# Patient Record
Sex: Female | Born: 1940
Health system: Southern US, Community
[De-identification: ages and names within clinical notes are randomized; demographics above are authoritative.]

## PROBLEM LIST (undated history)

## (undated) DIAGNOSIS — C679 Malignant neoplasm of bladder, unspecified: Secondary | ICD-10-CM

## (undated) DIAGNOSIS — I509 Heart failure, unspecified: Secondary | ICD-10-CM

## (undated) DIAGNOSIS — I10 Essential (primary) hypertension: Secondary | ICD-10-CM

## (undated) DIAGNOSIS — E079 Disorder of thyroid, unspecified: Secondary | ICD-10-CM

## (undated) DIAGNOSIS — M797 Fibromyalgia: Secondary | ICD-10-CM

## (undated) DIAGNOSIS — D649 Anemia, unspecified: Secondary | ICD-10-CM

## (undated) HISTORY — PX: BREAST LUMPECTOMY: SHX2

## (undated) HISTORY — DX: Malignant neoplasm of bladder, unspecified: C67.9

## (undated) HISTORY — PX: CARDIAC CATHETERIZATION: SHX172

## (undated) HISTORY — PX: BREAST SURGERY: SHX581

## (undated) HISTORY — DX: Essential (primary) hypertension: I10

## (undated) HISTORY — PX: COLONOSCOPY: SHX174

## (undated) HISTORY — DX: Fibromyalgia: M79.7

## (undated) HISTORY — DX: Heart failure, unspecified: I50.9

## (undated) HISTORY — PX: BLADDER SURGERY: SHX569

## (undated) HISTORY — DX: Disorder of thyroid, unspecified: E07.9

## (undated) HISTORY — DX: Anemia, unspecified: D64.9

## (undated) HISTORY — PX: CATARACT EXTRACTION: SUR2

## (undated) HISTORY — PX: CHOLECYSTECTOMY: SHX55

## (undated) HISTORY — PX: ABDOMINAL HYSTERECTOMY: SHX81

---

## 1999-11-04 ENCOUNTER — Ambulatory Visit (HOSPITAL_COMMUNITY): Admission: RE | Admit: 1999-11-04 | Discharge: 1999-11-04 | Payer: Self-pay | Admitting: Internal Medicine

## 1999-11-04 ENCOUNTER — Encounter: Payer: Self-pay | Admitting: Internal Medicine

## 2002-06-22 ENCOUNTER — Encounter: Payer: Self-pay | Admitting: Internal Medicine

## 2002-06-22 ENCOUNTER — Inpatient Hospital Stay (HOSPITAL_COMMUNITY): Admission: EM | Admit: 2002-06-22 | Discharge: 2002-06-24 | Payer: Self-pay | Admitting: Emergency Medicine

## 2002-07-08 ENCOUNTER — Encounter: Admission: RE | Admit: 2002-07-08 | Discharge: 2002-10-06 | Payer: Self-pay | Admitting: Internal Medicine

## 2002-12-24 LAB — HM COLONOSCOPY

## 2004-01-05 ENCOUNTER — Ambulatory Visit: Payer: Self-pay | Admitting: Internal Medicine

## 2004-01-15 ENCOUNTER — Ambulatory Visit: Payer: Self-pay | Admitting: Internal Medicine

## 2004-09-11 ENCOUNTER — Ambulatory Visit: Payer: Self-pay | Admitting: Internal Medicine

## 2004-09-16 ENCOUNTER — Ambulatory Visit: Payer: Self-pay | Admitting: Internal Medicine

## 2004-11-22 ENCOUNTER — Ambulatory Visit: Payer: Self-pay | Admitting: Internal Medicine

## 2005-04-04 ENCOUNTER — Ambulatory Visit: Payer: Self-pay | Admitting: Internal Medicine

## 2005-04-10 ENCOUNTER — Ambulatory Visit: Payer: Self-pay | Admitting: Internal Medicine

## 2005-04-29 ENCOUNTER — Ambulatory Visit: Payer: Self-pay | Admitting: Internal Medicine

## 2005-07-09 ENCOUNTER — Ambulatory Visit: Payer: Self-pay | Admitting: Internal Medicine

## 2005-09-27 ENCOUNTER — Ambulatory Visit: Payer: Self-pay | Admitting: Internal Medicine

## 2006-02-10 ENCOUNTER — Ambulatory Visit: Payer: Self-pay | Admitting: Internal Medicine

## 2006-02-10 LAB — CONVERTED CEMR LAB
ALT: 21 units/L (ref 0–40)
AST: 23 units/L (ref 0–37)
BUN: 15 mg/dL (ref 6–23)
Basophils Absolute: 0.1 10*3/uL (ref 0.0–0.1)
Basophils Relative: 1 % (ref 0.0–1.0)
Chol/HDL Ratio, serum: 6
Cholesterol: 259 mg/dL (ref 0–200)
Creatinine, Ser: 0.8 mg/dL (ref 0.4–1.2)
Creatinine,U: 100.8 mg/dL
Digoxin level, serum: 0.5 ng/mL — ABNORMAL LOW (ref 0.8–2.0)
Eosinophil percent: 2 % (ref 0.0–5.0)
HCT: 40.1 % (ref 36.0–46.0)
HDL: 43 mg/dL (ref >39.0)
Hemoglobin: 13.3 g/dL (ref 12.0–15.0)
Hgb A1c MFr Bld: 7.6 % — ABNORMAL HIGH (ref 4.6–6.0)
LDL DIRECT: 166.6 mg/dL
Lymphocytes Relative: 20.5 % (ref 12.0–46.0)
MCHC: 33.3 g/dL (ref 30.0–36.0)
MCV: 85.6 fL (ref 78.0–100.0)
Microalb Creat Ratio: 20.8 mg/g (ref 0.0–30.0)
Microalb, Ur: 2.1 mg/dL — ABNORMAL HIGH (ref 0.0–1.9)
Monocytes Absolute: 0.7 10*3/uL (ref 0.2–0.7)
Monocytes Relative: 6 % (ref 3.0–11.0)
Neutro Abs: 8.9 10*3/uL — ABNORMAL HIGH (ref 1.4–7.7)
Neutrophils Relative %: 70.5 % (ref 43.0–77.0)
Platelets: 310 10*3/uL (ref 150–400)
Potassium: 3.9 meq/L (ref 3.5–5.1)
RBC: 4.68 M/uL (ref 3.87–5.11)
RDW: 12.6 % (ref 11.5–14.6)
Triglyceride fasting, serum: 285 mg/dL (ref 0–149)
VLDL: 57 mg/dL — ABNORMAL HIGH (ref 0–40)
WBC: 12.4 10*3/uL — ABNORMAL HIGH (ref 4.5–10.5)

## 2006-05-29 DIAGNOSIS — E039 Hypothyroidism, unspecified: Secondary | ICD-10-CM | POA: Insufficient documentation

## 2006-05-29 DIAGNOSIS — I509 Heart failure, unspecified: Secondary | ICD-10-CM | POA: Insufficient documentation

## 2006-05-29 DIAGNOSIS — K529 Noninfective gastroenteritis and colitis, unspecified: Secondary | ICD-10-CM | POA: Insufficient documentation

## 2006-07-06 ENCOUNTER — Ambulatory Visit: Payer: Self-pay | Admitting: Internal Medicine

## 2006-07-06 DIAGNOSIS — L259 Unspecified contact dermatitis, unspecified cause: Secondary | ICD-10-CM | POA: Insufficient documentation

## 2006-07-09 ENCOUNTER — Ambulatory Visit: Payer: Self-pay | Admitting: Internal Medicine

## 2006-08-10 ENCOUNTER — Ambulatory Visit: Payer: Self-pay | Admitting: Internal Medicine

## 2006-08-10 LAB — CONVERTED CEMR LAB
ALT: 25 units/L (ref 0–35)
AST: 20 units/L (ref 0–37)
BUN: 12 mg/dL (ref 6–23)
Cholesterol: 284 mg/dL (ref 0–200)
Creatinine, Ser: 0.7 mg/dL (ref 0.4–1.2)
Creatinine,U: 105.5 mg/dL
Direct LDL: 178.2 mg/dL
HDL: 41.7 mg/dL (ref >39.0)
Hgb A1c MFr Bld: 8.4 % — ABNORMAL HIGH (ref 4.6–6.0)
Microalb Creat Ratio: 46.4 mg/g — ABNORMAL HIGH (ref 0.0–30.0)
Microalb, Ur: 4.9 mg/dL — ABNORMAL HIGH (ref 0.0–1.9)
Potassium: 3.9 meq/L (ref 3.5–5.1)
TSH: 2.37 microintl units/mL (ref 0.35–5.50)
Total CHOL/HDL Ratio: 6.8
Triglycerides: 450 mg/dL (ref 0–149)
VLDL: 90 mg/dL — ABNORMAL HIGH (ref 0–40)

## 2006-08-14 ENCOUNTER — Ambulatory Visit: Payer: Self-pay | Admitting: Internal Medicine

## 2006-08-14 LAB — CONVERTED CEMR LAB
Cholesterol, target level: 200 mg/dL
HDL goal, serum: 40 mg/dL
LDL Goal: 100 mg/dL

## 2006-10-30 ENCOUNTER — Ambulatory Visit: Payer: Self-pay | Admitting: Internal Medicine

## 2006-11-01 LAB — CONVERTED CEMR LAB
ALT: 26 units/L (ref 0–35)
AST: 22 units/L (ref 0–37)
Cholesterol: 200 mg/dL (ref 0–200)
Direct LDL: 100.7 mg/dL
HDL: 41 mg/dL (ref >39.0)
Hgb A1c MFr Bld: 8.1 % — ABNORMAL HIGH (ref 4.6–6.0)
Total CHOL/HDL Ratio: 4.9
Triglycerides: 455 mg/dL (ref 0–149)
VLDL: 91 mg/dL — ABNORMAL HIGH (ref 0–40)

## 2006-11-02 ENCOUNTER — Encounter (INDEPENDENT_AMBULATORY_CARE_PROVIDER_SITE_OTHER): Payer: Self-pay | Admitting: *Deleted

## 2006-11-05 ENCOUNTER — Ambulatory Visit: Payer: Self-pay | Admitting: Internal Medicine

## 2006-11-05 DIAGNOSIS — E1165 Type 2 diabetes mellitus with hyperglycemia: Secondary | ICD-10-CM | POA: Insufficient documentation

## 2006-11-11 ENCOUNTER — Telehealth (INDEPENDENT_AMBULATORY_CARE_PROVIDER_SITE_OTHER): Payer: Self-pay | Admitting: *Deleted

## 2006-11-12 ENCOUNTER — Telehealth (INDEPENDENT_AMBULATORY_CARE_PROVIDER_SITE_OTHER): Payer: Self-pay | Admitting: *Deleted

## 2007-01-12 ENCOUNTER — Telehealth (INDEPENDENT_AMBULATORY_CARE_PROVIDER_SITE_OTHER): Payer: Self-pay | Admitting: *Deleted

## 2007-03-25 ENCOUNTER — Telehealth (INDEPENDENT_AMBULATORY_CARE_PROVIDER_SITE_OTHER): Payer: Self-pay | Admitting: *Deleted

## 2007-05-06 ENCOUNTER — Telehealth (INDEPENDENT_AMBULATORY_CARE_PROVIDER_SITE_OTHER): Payer: Self-pay | Admitting: *Deleted

## 2007-05-10 ENCOUNTER — Telehealth (INDEPENDENT_AMBULATORY_CARE_PROVIDER_SITE_OTHER): Payer: Self-pay | Admitting: *Deleted

## 2007-05-11 ENCOUNTER — Encounter: Payer: Self-pay | Admitting: Internal Medicine

## 2007-05-11 ENCOUNTER — Telehealth: Payer: Self-pay | Admitting: Internal Medicine

## 2007-05-13 ENCOUNTER — Ambulatory Visit: Payer: Self-pay | Admitting: Internal Medicine

## 2007-05-13 LAB — CONVERTED CEMR LAB
ALT: 24 units/L (ref 0–35)
AST: 24 units/L (ref 0–37)
BUN: 13 mg/dL (ref 6–23)
Cholesterol: 164 mg/dL (ref 0–200)
Creatinine, Ser: 0.8 mg/dL (ref 0.4–1.2)
Direct LDL: 85.7 mg/dL
HDL: 37.1 mg/dL — ABNORMAL LOW (ref >39.0)
Hgb A1c MFr Bld: 8.8 % — ABNORMAL HIGH (ref 4.6–6.0)
TSH: 1.78 microintl units/mL (ref 0.35–5.50)
Total CHOL/HDL Ratio: 4.4
Triglycerides: 307 mg/dL (ref 0–149)
VLDL: 61 mg/dL — ABNORMAL HIGH (ref 0–40)

## 2007-05-20 ENCOUNTER — Ambulatory Visit: Payer: Self-pay | Admitting: Internal Medicine

## 2007-05-20 DIAGNOSIS — E782 Mixed hyperlipidemia: Secondary | ICD-10-CM | POA: Insufficient documentation

## 2007-06-01 ENCOUNTER — Telehealth (INDEPENDENT_AMBULATORY_CARE_PROVIDER_SITE_OTHER): Payer: Self-pay | Admitting: *Deleted

## 2007-08-20 ENCOUNTER — Ambulatory Visit: Payer: Self-pay | Admitting: Internal Medicine

## 2007-08-20 DIAGNOSIS — R5383 Other fatigue: Secondary | ICD-10-CM

## 2007-08-20 DIAGNOSIS — IMO0001 Reserved for inherently not codable concepts without codable children: Secondary | ICD-10-CM | POA: Insufficient documentation

## 2007-08-20 DIAGNOSIS — R5381 Other malaise: Secondary | ICD-10-CM | POA: Insufficient documentation

## 2007-08-30 LAB — CONVERTED CEMR LAB
ALT: 25 units/L (ref 0–35)
AST: 28 units/L (ref 0–37)
Albumin: 3.6 g/dL (ref 3.5–5.2)
Alkaline Phosphatase: 54 units/L (ref 39–117)
BUN: 10 mg/dL (ref 6–23)
Basophils Absolute: 0.1 10*3/uL (ref 0.0–0.1)
Basophils Relative: 0.9 % (ref 0.0–3.0)
Bilirubin, Direct: 0.1 mg/dL (ref 0.0–0.3)
Cholesterol: 169 mg/dL (ref 0–200)
Creatinine, Ser: 0.8 mg/dL (ref 0.4–1.2)
Direct LDL: 89.8 mg/dL
Eosinophils Absolute: 0.3 10*3/uL (ref 0.0–0.7)
Eosinophils Relative: 3.4 % (ref 0.0–5.0)
HCT: 36.2 % (ref 36.0–46.0)
HDL: 37 mg/dL — ABNORMAL LOW (ref >39.0)
Hemoglobin: 12.3 g/dL (ref 12.0–15.0)
Hgb A1c MFr Bld: 8.3 % — ABNORMAL HIGH (ref 4.6–6.0)
Lymphocytes Relative: 23.5 % (ref 12.0–46.0)
MCHC: 33.9 g/dL (ref 30.0–36.0)
MCV: 83 fL (ref 78.0–100.0)
Monocytes Absolute: 0.9 10*3/uL (ref 0.1–1.0)
Monocytes Relative: 9.7 % (ref 3.0–12.0)
Neutro Abs: 5.7 10*3/uL (ref 1.4–7.7)
Neutrophils Relative %: 62.5 % (ref 43.0–77.0)
Platelets: 238 10*3/uL (ref 150–400)
Potassium: 4.4 meq/L (ref 3.5–5.1)
RBC: 4.36 M/uL (ref 3.87–5.11)
RDW: 12.6 % (ref 11.5–14.6)
TSH: 1.12 microintl units/mL (ref 0.35–5.50)
Total Bilirubin: 0.7 mg/dL (ref 0.3–1.2)
Total CHOL/HDL Ratio: 4.6
Total Protein: 7.1 g/dL (ref 6.0–8.3)
Triglycerides: 291 mg/dL (ref 0–149)
VLDL: 58 mg/dL — ABNORMAL HIGH (ref 0–40)
Vit D, 1,25-Dihydroxy: 22 — ABNORMAL LOW (ref 30–89)
WBC: 9.1 10*3/uL (ref 4.5–10.5)

## 2008-03-28 ENCOUNTER — Telehealth (INDEPENDENT_AMBULATORY_CARE_PROVIDER_SITE_OTHER): Payer: Self-pay | Admitting: *Deleted

## 2008-03-28 ENCOUNTER — Ambulatory Visit: Payer: Self-pay | Admitting: Internal Medicine

## 2008-04-13 LAB — CONVERTED CEMR LAB
BUN: 15 mg/dL (ref 6–23)
Creatinine, Ser: 0.8 mg/dL (ref 0.4–1.2)
Creatinine,U: 104.7 mg/dL
Hgb A1c MFr Bld: 7.9 % — ABNORMAL HIGH (ref 4.6–6.0)
Microalb Creat Ratio: 166.2 mg/g — ABNORMAL HIGH (ref 0.0–30.0)
Microalb, Ur: 17.4 mg/dL — ABNORMAL HIGH (ref 0.0–1.9)
Potassium: 4.1 meq/L (ref 3.5–5.1)

## 2008-04-27 ENCOUNTER — Ambulatory Visit: Payer: Self-pay | Admitting: Internal Medicine

## 2008-04-27 DIAGNOSIS — I10 Essential (primary) hypertension: Secondary | ICD-10-CM | POA: Insufficient documentation

## 2008-05-12 ENCOUNTER — Telehealth (INDEPENDENT_AMBULATORY_CARE_PROVIDER_SITE_OTHER): Payer: Self-pay | Admitting: *Deleted

## 2008-06-22 ENCOUNTER — Ambulatory Visit: Payer: Self-pay | Admitting: Internal Medicine

## 2008-06-30 ENCOUNTER — Encounter (INDEPENDENT_AMBULATORY_CARE_PROVIDER_SITE_OTHER): Payer: Self-pay | Admitting: *Deleted

## 2008-06-30 LAB — CONVERTED CEMR LAB
ALT: 20 units/L (ref 0–35)
AST: 22 units/L (ref 0–37)
Albumin: 3.5 g/dL (ref 3.5–5.2)
Alkaline Phosphatase: 57 units/L (ref 39–117)
BUN: 15 mg/dL (ref 6–23)
Bilirubin, Direct: 0 mg/dL (ref 0.0–0.3)
Cholesterol: 127 mg/dL (ref 0–200)
Creatinine, Ser: 0.8 mg/dL (ref 0.4–1.2)
Direct LDL: 68.1 mg/dL
HDL: 36.9 mg/dL — ABNORMAL LOW (ref >39.00)
Hgb A1c MFr Bld: 7.8 % — ABNORMAL HIGH (ref 4.6–6.5)
Potassium: 3.5 meq/L (ref 3.5–5.1)
TSH: 1.11 microintl units/mL (ref 0.35–5.50)
Total Bilirubin: 0.3 mg/dL (ref 0.3–1.2)
Total CHOL/HDL Ratio: 3
Total Protein: 6.8 g/dL (ref 6.0–8.3)
Triglycerides: 213 mg/dL — ABNORMAL HIGH (ref 0.0–149.0)
VLDL: 42.6 mg/dL — ABNORMAL HIGH (ref 0.0–40.0)

## 2008-07-11 ENCOUNTER — Telehealth: Payer: Self-pay | Admitting: Internal Medicine

## 2008-07-13 ENCOUNTER — Encounter (INDEPENDENT_AMBULATORY_CARE_PROVIDER_SITE_OTHER): Payer: Self-pay | Admitting: *Deleted

## 2008-08-31 ENCOUNTER — Encounter: Payer: Self-pay | Admitting: Internal Medicine

## 2008-09-26 ENCOUNTER — Ambulatory Visit: Payer: Self-pay | Admitting: Internal Medicine

## 2008-09-26 DIAGNOSIS — L509 Urticaria, unspecified: Secondary | ICD-10-CM | POA: Insufficient documentation

## 2009-01-15 ENCOUNTER — Telehealth (INDEPENDENT_AMBULATORY_CARE_PROVIDER_SITE_OTHER): Payer: Self-pay | Admitting: *Deleted

## 2009-02-22 ENCOUNTER — Ambulatory Visit: Payer: Self-pay | Admitting: Internal Medicine

## 2009-02-22 LAB — CONVERTED CEMR LAB
Protein, U semiquant: NEGATIVE
Urobilinogen, UA: 0.2

## 2009-02-26 LAB — CONVERTED CEMR LAB
Basophils Absolute: 0.1 10*3/uL (ref 0.0–0.1)
Bilirubin, Direct: 0 mg/dL (ref 0.0–0.3)
Cholesterol: 143 mg/dL (ref 0–200)
Creatinine, Ser: 0.8 mg/dL (ref 0.4–1.2)
Eosinophils Absolute: 0.2 10*3/uL (ref 0.0–0.7)
Eosinophils Relative: 2.3 % (ref 0.0–5.0)
LDL Cholesterol: 62 mg/dL (ref 0–99)
MCV: 75.2 fL — ABNORMAL LOW (ref 78.0–100.0)
Monocytes Absolute: 0.9 10*3/uL (ref 0.1–1.0)
Neutrophils Relative %: 64.8 % (ref 43.0–77.0)
Platelets: 300 10*3/uL (ref 150.0–400.0)
Potassium: 4.5 meq/L (ref 3.5–5.1)
RDW: 15.9 % — ABNORMAL HIGH (ref 11.5–14.6)
TSH: 0.96 microintl units/mL (ref 0.35–5.50)
Total Bilirubin: 0.6 mg/dL (ref 0.3–1.2)
Total Protein: 7.5 g/dL (ref 6.0–8.3)
VLDL: 36.8 mg/dL (ref 0.0–40.0)
WBC: 10.4 10*3/uL (ref 4.5–10.5)

## 2009-03-05 ENCOUNTER — Telehealth (INDEPENDENT_AMBULATORY_CARE_PROVIDER_SITE_OTHER): Payer: Self-pay | Admitting: *Deleted

## 2009-03-05 ENCOUNTER — Ambulatory Visit: Payer: Self-pay | Admitting: Internal Medicine

## 2009-03-06 ENCOUNTER — Encounter: Payer: Self-pay | Admitting: Internal Medicine

## 2009-03-21 ENCOUNTER — Telehealth: Payer: Self-pay | Admitting: Internal Medicine

## 2009-03-21 DIAGNOSIS — R350 Frequency of micturition: Secondary | ICD-10-CM | POA: Insufficient documentation

## 2009-03-22 ENCOUNTER — Encounter: Payer: Self-pay | Admitting: Internal Medicine

## 2009-03-27 ENCOUNTER — Encounter: Payer: Self-pay | Admitting: Internal Medicine

## 2009-03-28 ENCOUNTER — Telehealth: Payer: Self-pay | Admitting: Internal Medicine

## 2009-04-04 ENCOUNTER — Telehealth: Payer: Self-pay | Admitting: Internal Medicine

## 2009-04-19 ENCOUNTER — Encounter: Payer: Self-pay | Admitting: Internal Medicine

## 2009-04-25 ENCOUNTER — Ambulatory Visit: Payer: Self-pay | Admitting: Internal Medicine

## 2009-04-30 LAB — CONVERTED CEMR LAB
Basophils Relative: 1.1 % (ref 0.0–3.0)
Eosinophils Relative: 3.3 % (ref 0.0–5.0)
Folate: >20 ng/mL
Iron: 35 ug/dL — ABNORMAL LOW (ref 42–145)
Lymphocytes Relative: 22.1 % (ref 12.0–46.0)
Monocytes Relative: 6.6 % (ref 3.0–12.0)
Neutrophils Relative %: 66.9 % (ref 43.0–77.0)
Platelets: 304 10*3/uL (ref 150.0–400.0)
RBC: 4.34 M/uL (ref 3.87–5.11)
Saturation Ratios: 6.8 % — ABNORMAL LOW (ref 20.0–50.0)
Transferrin: 365.4 mg/dL — ABNORMAL HIGH (ref 212.0–360.0)
WBC: 11.8 10*3/uL — ABNORMAL HIGH (ref 4.5–10.5)

## 2009-05-09 ENCOUNTER — Ambulatory Visit: Payer: Self-pay | Admitting: Internal Medicine

## 2009-05-09 DIAGNOSIS — E538 Deficiency of other specified B group vitamins: Secondary | ICD-10-CM | POA: Insufficient documentation

## 2009-05-16 ENCOUNTER — Ambulatory Visit: Payer: Self-pay | Admitting: Internal Medicine

## 2009-05-23 ENCOUNTER — Ambulatory Visit: Payer: Self-pay | Admitting: Internal Medicine

## 2009-05-30 ENCOUNTER — Ambulatory Visit: Payer: Self-pay | Admitting: Internal Medicine

## 2009-06-26 ENCOUNTER — Encounter: Payer: Self-pay | Admitting: Internal Medicine

## 2009-06-29 ENCOUNTER — Ambulatory Visit: Payer: Self-pay | Admitting: Internal Medicine

## 2009-08-10 ENCOUNTER — Telehealth: Payer: Self-pay | Admitting: Internal Medicine

## 2009-09-11 ENCOUNTER — Telehealth: Payer: Self-pay | Admitting: Internal Medicine

## 2009-10-16 ENCOUNTER — Encounter (INDEPENDENT_AMBULATORY_CARE_PROVIDER_SITE_OTHER): Payer: Self-pay | Admitting: *Deleted

## 2009-10-26 ENCOUNTER — Telehealth (INDEPENDENT_AMBULATORY_CARE_PROVIDER_SITE_OTHER): Payer: Self-pay | Admitting: *Deleted

## 2009-10-30 ENCOUNTER — Ambulatory Visit: Payer: Self-pay | Admitting: Internal Medicine

## 2009-10-31 LAB — CONVERTED CEMR LAB
AST: 21 units/L (ref 0–37)
Albumin: 4.1 g/dL (ref 3.5–5.2)
Alkaline Phosphatase: 63 units/L (ref 39–117)
Basophils Absolute: 0.1 10*3/uL (ref 0.0–0.1)
Bilirubin, Direct: 0.1 mg/dL (ref 0.0–0.3)
Cholesterol: 152 mg/dL (ref 0–200)
Direct LDL: 75.9 mg/dL
Eosinophils Absolute: 0.3 10*3/uL (ref 0.0–0.7)
HCT: 32.1 % — ABNORMAL LOW (ref 36.0–46.0)
Hemoglobin: 10.3 g/dL — ABNORMAL LOW (ref 12.0–15.0)
Hgb A1c MFr Bld: 8.6 % — ABNORMAL HIGH (ref 4.6–6.5)
Lymphocytes Relative: 27.4 % (ref 12.0–46.0)
Lymphs Abs: 3.2 10*3/uL (ref 0.7–4.0)
MCHC: 32.1 g/dL (ref 30.0–36.0)
Monocytes Relative: 9.3 % (ref 3.0–12.0)
Neutro Abs: 7 10*3/uL (ref 1.4–7.7)
Platelets: 298 10*3/uL (ref 150.0–400.0)
Potassium: 4.5 meq/L (ref 3.5–5.1)
RDW: 18.1 % — ABNORMAL HIGH (ref 11.5–14.6)
TSH: 2.97 microintl units/mL (ref 0.35–5.50)
Total Protein: 7.6 g/dL (ref 6.0–8.3)

## 2009-11-19 ENCOUNTER — Encounter: Payer: Self-pay | Admitting: Internal Medicine

## 2009-11-22 ENCOUNTER — Ambulatory Visit: Payer: Self-pay | Admitting: Internal Medicine

## 2009-11-22 ENCOUNTER — Telehealth: Payer: Self-pay | Admitting: Internal Medicine

## 2009-11-22 DIAGNOSIS — C679 Malignant neoplasm of bladder, unspecified: Secondary | ICD-10-CM | POA: Insufficient documentation

## 2009-11-26 ENCOUNTER — Telehealth: Payer: Self-pay | Admitting: Internal Medicine

## 2009-11-26 ENCOUNTER — Encounter: Payer: Self-pay | Admitting: Internal Medicine

## 2009-11-30 ENCOUNTER — Telehealth (INDEPENDENT_AMBULATORY_CARE_PROVIDER_SITE_OTHER): Payer: Self-pay | Admitting: *Deleted

## 2010-02-26 ENCOUNTER — Telehealth (INDEPENDENT_AMBULATORY_CARE_PROVIDER_SITE_OTHER): Payer: Self-pay | Admitting: *Deleted

## 2010-03-05 NOTE — Progress Notes (Signed)
Summary: Sierra Fisher  bladder surgery   Phone Note Outgoing Call Call back at [facility] Phone [phone]   Call placed by: [facility] CMA,  August 10, 2009 5:22 PM Summary of Call: Pt left VM that she would like to let dr Taylee Gunnells know that she having bladder surgery again on wednesday for a bladder tumor.....................Marland KitchenFelecia Deloach CMA  August 10, 2009 5:24 PM   Follow-up for Phone Call        noted Follow-up by: Marga Melnick MD,  August 12, 2009 2:08 PM

## 2010-03-05 NOTE — Progress Notes (Signed)
Summary: Med Concerns  Phone Note Call from Patient Call back at Home Phone [phone]   Summary of Call: I spoke with patient about her prescriptions. She was not aware that her insurance would not cover the Janumet. She also did not get her Benazepril b/c [facility]. I made patient aware that we would been fix them and [facility] from [facility]. Initial call taken by: Lucious Groves CMA,  November 30, 2009 12:05 PM  Follow-up for Phone Call        Message left on my voicemail: Patient would like a call  to discuss meds   I called patient back and she was never informed about her Janumet not being covered and would like to know what she is suppose to be doing. I reviewed phone note from 11/26/09 and patient to take Onglyza and Metformin. Patient requested rx's to be sent [facility] (done)  Follow-up by: Shonna Chock CMA,  November 30, 2009 1:27 PM    Prescriptions: METFORMIN HCL 1000 MG TABS (METFORMIN HCL) 1 two times a day with 2 largest meals  #180 x 1   Entered by:   Shonna Chock CMA   Authorized by:   Marga Melnick MD   Signed by:   Shonna Chock CMA on 11/30/2009   Method used:   Electronically to        [facility] [phone]* (retail)       [address]       [place], [place]  [address]       Ph: [phone]       Fax: [phone]   RxID:   [id number] ONGLYZA 5 MG TABS (SAXAGLIPTIN HCL) 1 once daily ( in place of Janumet as per insurance)  #30 x 2   Entered by:   Shonna Chock CMA   Authorized by:   Marga Melnick MD   Signed by:   Shonna Chock CMA on 11/30/2009   Method used:   Electronically to        [facility] [phone]* (retail)       [address]       [place], [place]  [address]       Ph: [phone]       Fax: [phone]   RxID:   [id number]

## 2010-03-05 NOTE — Assessment & Plan Note (Signed)
Summary: B-12/ TLJ/ HOPPER'S PT/NWS  Nurse Visit   Allergies: No Known Drug Allergies  Medication Administration  Injection # 1:    Medication: Vit B12 1000 mcg    Diagnosis: VITAMIN B12 DEFICIENCY (ICD-266.2)    Route: IM    Site: R deltoid    Exp Date: 01/04/2011    Lot #: [id number]    Mfr: American Regent    Patient tolerated injection without complications    Given by: Margaret Pyle, CMA (May 30, 2009 10:41 AM)  Orders Added: 1)  Admin of Therapeutic Inj  intramuscular or subcutaneous [96372] 2)  Vit B12 1000 mcg [J3420]

## 2010-03-05 NOTE — Assessment & Plan Note (Signed)
Summary: B12 SHOT/HOPPER PT-OK LOU/CD  Nurse Visit  CC: Pt came for first vitamin b-12 inj, given Left deltoid, lot [id number] exp: 10.2012./kb    Allergies: No Known Drug Allergies  Medication Administration  Injection # 1:    Medication: Vit B12 1000 mcg    Diagnosis: VITAMIN B12 DEFICIENCY (ICD-266.2)    Route: IM    Site: L deltoid    Exp Date: 11/04/2010    Lot #: [id number]    Mfr: American Regent    Patient tolerated injection without complications    Given by: Lucious Groves (May 09, 2009 2:15 PM)  Orders Added: 1)  Vit B12 1000 mcg [J3420] 2)  Admin of Therapeutic Inj  intramuscular or subcutaneous [21308]

## 2010-03-05 NOTE — Letter (Signed)
Summary: [facility]  [facility]   Imported By: Lanelle Bal 04/25/2009 11:02:39  _____________________________________________________________________  External Attachment:    Type:   Image     Comment:   External Document

## 2010-03-05 NOTE — Letter (Signed)
Summary: Primary Care Appointment Letter  [facility] [place]  [address] [place], [place] [address]   Phone: [phone]  Fax: [phone]    10/16/2009 MRN: [id number]  Sierra Fisher [address] [place], [place]  [address]  Dear [name],   Your Primary Care Physician Marga Melnick MD has indicated that:    _______it is time to schedule an appointment.    _______you missed your appointment on______ and need to call and          reschedule.    ___x____you need to have lab work done(Digoxin level checked (995.20).    _______you need to schedule an appointment discuss lab or test results.    _______you need to call to reschedule your appointment that is                       scheduled on _________.     Please call our office as soon as possible. Our phone number is [phone]. Please press option 1. Our office is open 8a-5p, Monday through Friday.     Thank you,    [facility]

## 2010-03-05 NOTE — Medication Information (Signed)
Summary: Prior Authorization & Approval for Onglyza/[employer]  Prior Authorization & Approval for Onglyza/[employer]   Imported By: Lanelle Bal 12/04/2009 10:15:06  _____________________________________________________________________  External Attachment:    Type:   Image     Comment:   External Document

## 2010-03-05 NOTE — Letter (Signed)
Summary: [facility] : [facility] @ [facility]   Imported By: Lanelle Bal 04/02/2009 12:13:40  _____________________________________________________________________  External Attachment:    Type:   Image     Comment:   External Document

## 2010-03-05 NOTE — Progress Notes (Signed)
Summary: no better  Phone Note Call from Patient   Summary of Call: PT LEFT vm THAT SHE IS NO BETTER, PT STATES THAT SHE IS STILL HAVING URINARY CONTROL ISSUE, AND  URGENCY. PT ADVISE PER URINE CULTURE TO reculture urine if symptoms persist. Pt coming in today to give sample................Marland KitchenFelecia Deloach CMA  March 05, 2009 12:23 PM

## 2010-03-05 NOTE — Assessment & Plan Note (Signed)
Summary: TO DICUSS LABS//PH   Vital Signs:  Patient profile:   70 year old female Weight:      200.4 pounds BMI:     35.07 Temp:     98.5 degrees F oral Pulse rate:   84 / minute Resp:     17 per minute BP sitting:   180 / 82  (left arm) Cuff size:   large  Vitals Entered By: Shonna Chock CMA (November 22, 2009 9:08 AM) CC: Follow-up visit: discuss labs (copy given), Type 2 diabetes mellitus follow-up   CC:  Follow-up visit: discuss labs (copy given) and Type 2 diabetes mellitus follow-up.  History of Present Illness:        Tyara  had bladder cancer in 03/2009 which was resected followed by intra bladder chemotherapy . She has had recurrent bladder tumors which will be removed as OP by Dr Pete Glatter, [facility] . Records from 02 & 04/2009 reviewed. Question of Actos relationship to bladder cancer discussed. Type 2 Diabetes Mellitus Follow-Up        The patient denies polyuria, polydipsia, blurred vision, self managed hypoglycemia, weight loss, weight gain, and numbness of extremities.  The patient denies the following symptoms: neuropathic pain, chest pain, vomiting, orthostatic symptoms, poor wound healing, intermittent claudication, vision loss, and foot ulcer.  Since the last visit the patient reports poor dietary compliance and exercising regularly.  The patient has been measuring capillary blood glucose before breakfast (110-140+) and  pre  dinner (< 180).  Since the last visit, the patient reports having had no eye care( but appt pending) and no foot care.  A1c 8.6% which is average sugar approx 200 & long term cardiovascualr  risk of  72%(discussed).   Current Medications (verified): 1)  Levothyroxine Sodium 125 Mcg  Tabs (Levothyroxine Sodium) .Marland Kitchen.. 1tab Daily Except 1 and 1/2 Tab Tues, Thurs,sat 2)  Digoxin 0.25 Mg  Tabs (Digoxin) .... 1/2 Tab Daily Except 1 Tab Q Wed 3)  Coreg 25 Mg  Tabs (Carvedilol) .Marland Kitchen.. 1 By Mouth Bid 4)  Amitriptyline Hcl 50 Mg  Tabs (Amitriptyline Hcl) ....  1/2 Tab By Mouth Once Daily **appointment Due** 5)  Actos 30 Mg Tabs (Pioglitazone Hcl) .Marland Kitchen.. 1 By Mouth Once Daily 6)  Metformin Hcl 1000 Mg  Tabs (Metformin Hcl) .Marland Kitchen.. 1 By Mouth Bid 7)  Adult Aspirin Ec Low Strength 81 Mg  Tbec (Aspirin) 8)  Novolog Mix 70/30 70-30 % Susp (Insulin Aspart Prot & Aspart) .... 50 Units @ Dinner 9)  Pravastatin Sodium 40 Mg  Tabs (Pravastatin Sodium) .Marland Kitchen.. 1 At Bedtime 10)  Freestyle Strips .... Check 2-3 Times Daily Insulin Dependent 11)  Syringes, Short 0.5cc 12)  Triamterene-Hctz 37.5-25 Mg Caps (Triamterene-Hctz) .Marland Kitchen.. 1 Every Other Day  Allergies (verified): No Known Drug Allergies  Review of Systems Heme:  B12  was 77.  Physical Exam  General:  well-nourished,in no acute distress; alert,appropriate and cooperative throughout examination Lungs:  Normal respiratory effort, chest expands symmetrically. Lungs are clear to auscultation, no crackles or wheezes. Heart:  normal rate, regular rhythm, no gallop, no rub, no JVD, and grade 1 /6 systolic murmur.   Abdomen:  Bowel sounds positive,abdomen soft and non-tender without masses, organomegaly or hernias noted. Pulses:  R and L carotid,radial,dorsalis pedis and posterior tibial pulses are full and equal bilaterally Extremities:  No clubbing, cyanosis, edema. Good nail health Neurologic:  alert & oriented X3 and sensation intact to light touch over foot .   Skin:  Intact without  suspicious lesions or rashes Psych:  memory intact for recent and remote, normally interactive, and good eye contact.     Impression & Recommendations:  Problem # 1:  ESSENTIAL HYPERTENSION (ICD-401.9) uncontrolled  Her updated medication list for this problem includes:    Coreg 25 Mg Tabs (Carvedilol) .Marland Kitchen... 1 by mouth bid    Triamterene-hctz 37.5-25 Mg Caps (Triamterene-hctz) .Marland Kitchen... 1 every other day  Problem # 2:  DM, UNCOMPLICATED, TYPE II, UNCONTROLLED (ICD-250.02)  The following medications were removed from the  medication list:    Actos 30 Mg Tabs (Pioglitazone hcl) .Marland Kitchen... 1 by mouth once daily    Metformin Hcl 1000 Mg Tabs (Metformin hcl) .Marland Kitchen... 1 by mouth bid Her updated medication list for this problem includes:    Adult Aspirin Ec Low Strength 81 Mg Tbec (Aspirin)    Novolog Mix 70/30 70-30 % Susp (Insulin aspart prot & aspart) .Marland KitchenMarland KitchenMarland KitchenMarland Kitchen 50 units @ dinner    Janumet 50-1000 Mg Tabs (Sitagliptin-metformin hcl) .Marland Kitchen... 1 two times a day with meals  Problem # 3:  VITAMIN B12 DEFICIENCY (ICD-266.2)  Orders: Vit B12 1000 mcg (J3420) Admin of Therapeutic Inj  intramuscular or subcutaneous ([address])  Problem # 4:  BLADDER CANCER (ICD-188.9)  Complete Medication List: 1)  Levothyroxine Sodium 125 Mcg Tabs (Levothyroxine sodium) .Marland Kitchen.. 1tab daily except 1 and 1/2 tab tues, thurs,sat 2)  Digoxin 0.25 Mg Tabs (Digoxin) .... 1/2 tab daily except 1 tab q wed 3)  Coreg 25 Mg Tabs (Carvedilol) .Marland Kitchen.. 1 by mouth bid 4)  Amitriptyline Hcl 50 Mg Tabs (Amitriptyline hcl) .... 1/2 tab by mouth once daily **appointment due** 5)  Adult Aspirin Ec Low Strength 81 Mg Tbec (Aspirin) 6)  Novolog Mix 70/30 70-30 % Susp (Insulin aspart prot & aspart) .... 50 units @ dinner 7)  Pravastatin Sodium 40 Mg Tabs (Pravastatin sodium) .Marland Kitchen.. 1 at bedtime 8)  Freestyle Strips  .... Check 2-3 times daily insulin dependent 9)  Syringes, Short 0.5cc  10)  Triamterene-hctz 37.5-25 Mg Caps (Triamterene-hctz) .Marland Kitchen.. 1 every other day 11)  Janumet 50-1000 Mg Tabs (Sitagliptin-metformin hcl) .Marland Kitchen.. 1 two times a day with meals 12)  Benazepril 20  .Marland Kitchen.. 1 once daily 13)  Cyanocobalamin 1000 Mcg/ml Soln (Cyanocobalamin) .Marland Kitchen.. 1 injection weekly x 4 weeks then 1 injection monthly  Patient Instructions: 1)  Weekly B12 X 4 then monthly therafter. Recheck B12 level after 6 months . 2)  Check your blood sugars regularly. If your readings are usually above : 150 or below 90 you should contact our office. 3)  See your eye doctor yearly to check for diabetic  eye damage. 4)  Check your feet each night for sore areas, calluses or signs of infection. Avoid HFCS sugar as discussed. 5)  Please schedule a follow-up LAB  appointment in 2 months for  6)  BUN,creat ,K+,HbgA1C. Prescriptions: CYANOCOBALAMIN 1000 MCG/ML SOLN (CYANOCOBALAMIN) 1 injection weekly x 4 weeks then 1 injection monthly  #53mo supply x 3   Entered by:   Shonna Chock CMA   Authorized by:   Marga Melnick MD   Signed by:   Shonna Chock CMA on 11/22/2009   Method used:   Electronically to        [facility] [phone]* (retail)       [address]       [place], [place]  [address]       Ph: [phone]       Fax: [phone]   RxID:   [id number]  JANUMET 50-1000 MG TABS (SITAGLIPTIN-METFORMIN HCL) 1 two times a day with meals  #60 x 2   Entered and Authorized by:   Marga Melnick MD   Signed by:   Marga Melnick MD on 11/22/2009   Method used:   Print then Give to Patient   RxID:   [id number] BENAZEPRIL 20 1 once daily  #30 x 2   Entered and Authorized by:   Marga Melnick MD   Signed by:   Marga Melnick MD on 11/22/2009   Method used:   Print then Give to Patient   RxID:   [id number] JANUMET 50-1000 MG TABS (SITAGLIPTIN-METFORMIN HCL) 1 two times a day with meals  #60 x 2   Entered and Authorized by:   Marga Melnick MD   Signed by:   Marga Melnick MD on 11/22/2009   Method used:   [facility]  [address] [facility] [address] [id number] (retail)       [address]       [place], [place]  [address]       Ph: [phone]       Fax: [phone]   RxID:   [id number]    Medication Administration  Injection # 1:    Medication: Vit B12 1000 mcg    Diagnosis: VITAMIN B12 DEFICIENCY (ICD-266.2)    Route: IM    Site: R deltoid    Exp Date: 04/2011    Lot #: [id number]    Mfr: American Regent    Patient tolerated injection without complications    Given by: Shonna Chock CMA (November 22, 2009 10:20 AM)  Orders Added: 1)  Est.  Patient Level IV [37628] 2)  Vit B12 1000 mcg [J3420] 3)  Admin of Therapeutic Inj  intramuscular or subcutaneous [31517]

## 2010-03-05 NOTE — Miscellaneous (Signed)
Summary: Orders Update  Clinical Lists Changes  Orders: Added new Service order of Venipuncture [phone]) - Signed Added new Test order of TLB-Lipid Panel (80061-LIPID) - Signed Added new Test order of TLB-Hepatic/Liver Function Pnl (80076-HEPATIC) - Signed Added new Test order of TLB-TSH (Thyroid Stimulating Hormone) (84443-TSH) - Signed Added new Test order of TLB-Creatinine, Blood (82565-CREA) - Signed Added new Test order of TLB-Potassium (K+) (84132-K) - Signed Added new Test order of TLB-BUN (Urea Nitrogen) (84520-BUN) - Signed Added new Test order of TLB-A1C / Hgb A1C (Glycohemoglobin) (83036-A1C) - Signed Added new Test order of TLB-Microalbumin/Creat Ratio, Urine (82043-MALB) - Signed Added new Test order of TLB-CBC Platelet - w/Differential (85025-CBCD) - Signed

## 2010-03-05 NOTE — Progress Notes (Signed)
Summary: Head Cold-Request ABX  Phone Note Call from Patient Call back at Home Phone [phone] Call back at [phone]   Caller: Patient Summary of Call: Patient with head cold: Right ear concerns, cough-productive at times, ,drainage, sore throat x 2 days.  Patient would like a antibiotic called in, patient was offered an appointment and refused said her schedule is tight right now. Patient was informed if she is sick enough to the point that she feels she needs an antibiotic she will need an appointment. Patient still refused and said for me to run this by Dr.Lui Bellis cause she is about to have surgery next Tuesday and she doesnt need this cold holding her back. [facility] [address] Initial call taken by: Shonna Chock,  April 04, 2009 11:01 AM  Follow-up for Phone Call        Unfortunately such "cold "   symptoms are due to viral process  > 95% of time if present < 4-7 days. An antibiotic would not kill virus & could lead to emergence of resistant bacteria. We can call in cough syrup (Phenergan with codeine) but  I also recommend Zicam Melts, vitamin C 2000 mg qd  & Echinacea. Neti not once daily as needed for any head congestion. Follow-up by: Marga Melnick MD,  April 04, 2009 2:16 PM  Additional Follow-up for Phone Call Additional follow up Details #1::        pt aware rx sent to pharmacy..............Marland KitchenFelecia Deloach CMA  April 04, 2009 2:51 PM     New/Updated Medications: PROMETHAZINE-CODEINE 6.25-10 MG/5ML SYRP (PROMETHAZINE-CODEINE) Take 1 tsp every 6 hours as needed for cough Prescriptions: PROMETHAZINE-CODEINE 6.25-10 MG/5ML SYRP (PROMETHAZINE-CODEINE) Take 1 tsp every 6 hours as needed for cough  #120cc x 0   Entered by:   Jeremy Johann CMA   Authorized by:   Marga Melnick MD   Signed by:   Jeremy Johann CMA on 04/04/2009   Method used:   Printed then faxed to ...       [facility] [address] [phone]* (retail)       [address]       [place], [place]  [address]       Ph:  [phone]       Fax: [phone]   RxID:   [id number]

## 2010-03-05 NOTE — Progress Notes (Signed)
Summary: Refill  Phone Note Refill Request Call back at [phone] Message from:  Pharmacy on September 11, 2009 11:05 AM  [facility] [address]. Maxide tab 37.5/25 1 every other day #90 Dyazide capsules are not available    Method Requested: Telephone to Pharmacy Initial call taken by: Barnie Mort,  September 11, 2009 11:06 AM  Follow-up for Phone Call        Dr.Hopper please advise on med change Follow-up by: Shonna Chock CMA,  September 11, 2009 11:36 AM    New/Updated Medications: TRIAMTERENE-HCTZ 37.5-25 MG CAPS (TRIAMTERENE-HCTZ) 1 every other day Prescriptions: TRIAMTERENE-HCTZ 37.5-25 MG CAPS (TRIAMTERENE-HCTZ) 1 every other day  #30 x 1   Entered by:   Shonna Chock CMA   Authorized by:   Marga Melnick MD   Signed by:   Shonna Chock CMA on 09/11/2009   Method used:   Electronically to        [facility] [phone]* (retail)       [address]       [place], [place]  [address]       Ph: [phone]       Fax: [phone]   RxID:   [id number]

## 2010-03-05 NOTE — Medication Information (Signed)
Summary: Fax Regarding Use of ACE or ARB/[employer]  Fax Regarding Use of ACE or ARB/[employer]   Imported By: Lanelle Bal 11/27/2009 10:49:30  _____________________________________________________________________  External Attachment:    Type:   Image     Comment:   External Document

## 2010-03-05 NOTE — Medication Information (Signed)
Summary: Letter Regarding ACE Inhibitor/[facility]  Letter Regarding ACE Inhibitor/[facility]   Imported By: Lanelle Bal 09/10/2009 11:10:19  _____________________________________________________________________  External Attachment:    Type:   Image     Comment:   External Document

## 2010-03-05 NOTE — Assessment & Plan Note (Signed)
Summary: B12/HOPPER PT,OK LOU/CD  Nurse Visit   Allergies: No Known Drug Allergies  Medication Administration  Injection # 1:    Medication: Vit B12 1000 mcg    Diagnosis: VITAMIN B12 DEFICIENCY (ICD-266.2)    Route: IM    Site: L deltoid    Exp Date: 02/04/2011    Lot #: [id number]    Mfr: American Regent    Patient tolerated injection without complications    Given by: Lamar Sprinkles, CMA (May 23, 2009 10:05 AM)  Orders Added: 1)  Admin of Therapeutic Inj  intramuscular or subcutaneous [96372] 2)  Vit B12 1000 mcg [J3420]   Medication Administration  Injection # 1:    Medication: Vit B12 1000 mcg    Diagnosis: VITAMIN B12 DEFICIENCY (ICD-266.2)    Route: IM    Site: L deltoid    Exp Date: 02/04/2011    Lot #: [id number]    Mfr: American Regent    Patient tolerated injection without complications    Given by: Lamar Sprinkles, CMA (May 23, 2009 10:05 AM)  Orders Added: 1)  Admin of Therapeutic Inj  intramuscular or subcutaneous [96372] 2)  Vit B12 1000 mcg [J3420]

## 2010-03-05 NOTE — Consult Note (Signed)
Summary: [facility]  [facility]   Imported By: Lanelle Bal 04/02/2009 12:12:21  _____________________________________________________________________  External Attachment:    Type:   Image     Comment:   External Document

## 2010-03-05 NOTE — Assessment & Plan Note (Signed)
Summary: Urinary Frequency x a long time/scm   Vital Signs:  Patient profile:   70 year old female Weight:      200.8 pounds BMI:     35.14 Temp:     98.6 degrees F oral Pulse rate:   84 / minute Resp:     15 per minute BP sitting:   144 / 86  (left arm) Cuff size:   large  Vitals Entered By: Shonna Chock (February 22, 2009 10:30 AM) CC: Urinary frquency x a couple of weeks Comments REVIEWED MED LIST, PATIENT AGREED DOSE AND INSTRUCTION CORRECT    CC:  Urinary frquency x a couple of weeks.  History of Present Illness: Urgency , freq & incontinence  with oliguria X 2 weeks except occasional voluminous volume.PMH of severe UTI; ? pyelonephritis. Sugars have been controlled ; FBS average 137.BP labile @ home.  Allergies (verified): No Known Drug Allergies  Review of Systems General:  Complains of chills, fever, and sweats. CV:  Complains of lightheadness; Lightheaded this week. GU:  Complains of nocturia; denies discharge, dysuria, and hematuria; Nocturia X 3. MS:  Complains of low back pain. Endo:  Complains of excessive thirst and excessive urination; denies excessive hunger.  Physical Exam  General:  well-nourished,in no acute distress; alert,appropriate and cooperative throughout examination Abdomen:  Bowel sounds positive,abdomen soft and non-tender without masses, organomegaly or hernias noted. Msk:  No flank tenderness; lay down & sat upw/o help Extremities:  No clubbing, cyanosis, edema. Neg SLR Neurologic:  alert & oriented X3, strength normal in lower extremities, and DTRs symmetrical and normal.   Skin:  Intact without suspicious lesions or rashes Cervical Nodes:  No lymphadenopathy noted Axillary Nodes:  No palpable lymphadenopathy Psych:  memory intact for recent and remote, normally interactive, and good eye contact.     Impression & Recommendations:  Problem # 1:  UTI (ICD-599.0)  Orders: T-Culture, Urine ([id number])  Her updated medication list for  this problem includes:    Ciprofloxacin Hcl 500 Mg Tabs (Ciprofloxacin hcl) .Marland Kitchen... 1 two times a day  Problem # 2:  ESSENTIAL HYPERTENSION (ICD-401.9)  Her updated medication list for this problem includes:    Coreg 25 Mg Tabs (Carvedilol) .Marland Kitchen... 1 by mouth bid    Triamterene-hctz 37.5-25 Mg Caps (Triamterene-hctz) .Marland Kitchen... 1 by mouth qod  Complete Medication List: 1)  Levothyroxine Sodium 125 Mcg Tabs (Levothyroxine sodium) .Marland Kitchen.. 1tab daily except 1 and 1/2 tab tues, thurs,sat 2)  Digoxin 0.25 Mg Tabs (Digoxin) .... 1/2 tab daily except 1 tab q wed 3)  Coreg 25 Mg Tabs (Carvedilol) .Marland Kitchen.. 1 by mouth bid 4)  Amitriptyline Hcl 50 Mg Tabs (Amitriptyline hcl) .... 1/2 tab by mouth qd 5)  Actos 30 Mg Tabs (Pioglitazone hcl) .Marland Kitchen.. 1 by mouth once daily 6)  Metformin Hcl 1000 Mg Tabs (Metformin hcl) .Marland Kitchen.. 1 by mouth bid 7)  Triamterene-hctz 37.5-25 Mg Caps (Triamterene-hctz) .Marland Kitchen.. 1 by mouth qod 8)  Adult Aspirin Ec Low Strength 81 Mg Tbec (Aspirin) 9)  Novolog Mix 70/30 70-30 % Susp (Insulin aspart prot & aspart) .... 35 units before breakfast, 30 units before dinner 10)  Pravastatin Sodium 40 Mg Tabs (Pravastatin sodium) .Marland Kitchen.. 1 at bedtime 11)  Freestyle Strips  .... Check 2-3 times daily insulin dependent 12)  Syringes, Short 0.5cc  13)  Hydroxyzine Pamoate 25 Mg Caps (Hydroxyzine pamoate) .Marland Kitchen.. 1-2 q 6 hrs as needed itching 14)  Ranitidine Hcl 150 Mg Tabs (Ranitidine hcl) .Marland Kitchen.. 1 two times a day 15)  Ciprofloxacin Hcl 500 Mg Tabs (Ciprofloxacin hcl) .Marland Kitchen.. 1 two times a day  Other Orders: UA Dipstick w/o Micro (manual) ([address]109)  Patient Instructions: 1)  Check your Blood Pressure regularly. If it is above: 140 /90 ON AVERAGE you should take diuretic once daily .monitor Diabetes closely as discussed Prescriptions: CIPROFLOXACIN HCL 500 MG TABS (CIPROFLOXACIN HCL) 1 two times a day  #20 x 0   Entered and Authorized by:   Marga Melnick MD   Signed by:   Marga Melnick MD on 02/22/2009   Method used:    Faxed to ...       [facility] [address] [phone]* (retail)       [address]       [place], [place]  [address]       Ph: [phone]       Fax: [phone]   RxID:   [id number]   Laboratory Results   Urine Tests    Routine Urinalysis   Color: yellow Appearance: Cloudy Glucose: negative   (Normal Range: Negative) Bilirubin: negative   (Normal Range: Negative) Ketone: negative   (Normal Range: Negative) Spec. Gravity: 1.010   (Normal Range: 1.003-1.035) Blood: large   (Normal Range: Negative) pH: 6.5   (Normal Range: 5.0-8.0) Protein: negative   (Normal Range: Negative) Urobilinogen: 0.2   (Normal Range: 0-1) Nitrite: positive   (Normal Range: Negative) Leukocyte Esterace: moderate   (Normal Range: Negative)    Comments: sent for culture     Appended Document: Urinary Frequency x a long time/scm

## 2010-03-05 NOTE — Progress Notes (Signed)
Summary: fyi fyi CT scan findings  Phone Note Call from Patient Call back at Home Phone [phone]   Caller: Patient Summary of Call: Patient had CT scan yesterday and was told to stop Metformin. Patient said she was told to contact her primary Dr. to discuss when to restart metformin.  Dr.Undra Harriman please advise Initial call taken by: Shonna Chock,  March 28, 2009 10:43 AM  Follow-up for Phone Call        please wait 1 week before restarting; stay well hydrated, drinking to thirst, up to 40 oz of water / day Follow-up by: Marga Melnick MD,  March 28, 2009 2:16 PM  Additional Follow-up for Phone Call Additional follow up Details #1::        pt states that she got her results back and it confirmed that she has bladder cancer. pt schedule to have surgery 04-10-09.Marland Kitchenpt aware.Marland KitchenMarland KitchenFelecia Deloach CMA  March 28, 2009 2:43 PM  Additional Follow-up by: Marga Melnick MD,  March 28, 2009 2:45 PM

## 2010-03-05 NOTE — Progress Notes (Signed)
Summary: prior auth  Phone Note Refill Request Message from:  Fax from Pharmacy on November 26, 2009 10:26 AM  Refills Requested: Medication #1:  JANUMET 50-1000 MG TABS 1 two times a day with meals prior auth [id number]  Initial call taken by: Okey Regal [place],  November 26, 2009 10:27 AM  Follow-up for Phone Call        Member number-- [phone] Not available online. Spoke with insurance company and patient must try and fail Onglyza before they will cover this med. Onglyza also requires prior authorization (forms requested). Ok to change patient to Onglyza for 30 day trial? Please advise. Follow-up by: Lucious Groves CMA,  November 26, 2009 11:38 AM  Additional Follow-up for Phone Call Additional follow up Details #1::        see Rxs Additional Follow-up by: Marga Melnick MD,  November 26, 2009 12:50 PM    Additional Follow-up for Phone Call Additional follow up Details #2::    Onglyza prior authorization completed and faxed. Will await reply from insurance company. Lucious Groves CMA  November 26, 2009 3:43 PM   New/Updated Medications: ONGLYZA 5 MG TABS (SAXAGLIPTIN HCL) 1 once daily ( in place of Janumet as per insurance) METFORMIN HCL 1000 MG TABS (METFORMIN HCL) 1 two times a day with 2 largest meals Prescriptions: METFORMIN HCL 1000 MG TABS (METFORMIN HCL) 1 two times a day with 2 largest meals  #180 x 1   Entered and Authorized by:   Marga Melnick MD   Signed by:   Marga Melnick MD on 11/26/2009   Method used:   Print then Mail to Patient   RxID:   [id number] ONGLYZA 5 MG TABS (SAXAGLIPTIN HCL) 1 once daily ( in place of Janumet as per insurance)  #30 x 2   Entered and Authorized by:   Marga Melnick MD   Signed by:   Marga Melnick MD on 11/26/2009   Method used:   Print then Mail to Patient   RxID:   [id number]   Appended Document: prior auth Onglyza approved until 02/02/10.

## 2010-03-05 NOTE — Progress Notes (Signed)
Summary: Ongoing Urinary Frequency-Triage  Phone Note Call from Patient Call back at Home Phone [phone]   Caller: Patient Summary of Call: Patient calls to state Urinary Frequency is still ongoing and she doesnt know what to do. I reviewed chart and seen that patient was seen for this 02/22/09, called to stated symptoms ongoing on 03/05/09. Patient then came in to collect urine sample and it was sent for culture, which was neg. I informed patient I will run this by Dr.Jenisa Monty, the next step may be a referral to a Urologist.  Hopp please advise  Shonna Chock  March 21, 2009 9:37 AM   Follow-up for Phone Call        see Referral Follow-up by: Marga Melnick MD,  March 21, 2009 11:25 AM  Additional Follow-up for Phone Call Additional follow up Details #1::        REFERRAL IN PROCESS, I WILL NOTIFY PATIENT. Additional Follow-up by: Magdalen Spatz [facility],  March 22, 2009 11:16 AM  New Problems: URINARY FREQUENCY (ICD-788.41)   New Problems: URINARY FREQUENCY (ICD-788.41)

## 2010-03-05 NOTE — Assessment & Plan Note (Signed)
Summary: Sierra Fisher Sierra Fisher  Nurse Visit  CC: Pt here  for B12 injection, after pt verified her DOB injection was administered into her left arm (deltoid), pt tolerated well/ ab   Allergies: No Known Drug Allergies  Medication Administration  Injection # 1:    Medication: Vit B12 1000 mcg    Diagnosis: VITAMIN B12 DEFICIENCY (ICD-266.2)    Route: IM    Site: L deltoid    Exp Date: 11/2010    Lot #: [id number]    Mfr: [employer] Regent    Patient tolerated injection without complications    Given by: Ami Bullins CMA (May 16, 2009 9:28 AM)  Orders Added: 1)  Admin of Therapeutic Inj  intramuscular or subcutaneous [96372] 2)  Vit B12 1000 mcg [J3420]

## 2010-03-05 NOTE — Progress Notes (Signed)
Summary: order for labs  Phone Note Call from Patient Call back at Home Phone [phone]   Caller: Patient Summary of Call: Spoke w/ patient to schedule lab appt is listed to have digoxin but she believes she is due for other labs and would like an order to have those done. Also mentioned that she is working on a lawsuit against [employer] and wanted to let Dr. Alwyn Ren know b/c he may contacted. Initial call taken by: Doristine Devoid CMA,  October 26, 2009 3:37 PM  Follow-up for Phone Call        Lipids, hepatic panel, TSH , digoxin level, CBC & dif, B12 level, A1c , BUN,creat, K+( 250.02,272.4,244.9,995.20, 281.0) Follow-up by: Marga Melnick MD,  October 26, 2009 3:54 PM

## 2010-03-05 NOTE — Progress Notes (Signed)
Summary: RX and meter  Phone Note Call from Patient Call back at Home Phone [phone]   Summary of Call: Patient left message on triage that her prescription was sent to the wrong pharmacy. She uses [facility] on [address]. Patient also forgot her meter, and is aware that we will put it up front for pick up. Initial call taken by: Lucious Groves CMA,  November 22, 2009 2:40 PM    Prescriptions: JANUMET 50-1000 MG TABS (SITAGLIPTIN-METFORMIN HCL) 1 two times a day with meals  #60 x 2   Entered by:   Lucious Groves CMA   Authorized by:   Marga Melnick MD   Signed by:   Lucious Groves CMA on 11/22/2009   Method used:   Electronically to        [facility] [phone]* (retail)       [address]       [place], [place]  [address]       Ph: [phone]       Fax: [phone]   RxID:   [id number]

## 2010-03-07 NOTE — Progress Notes (Signed)
Summary: Refill Requests  Phone Note Refill Request Call back at [phone] Message from:  Pharmacy on February 26, 2010 2:35 PM  Refills Requested: Medication #1:  DIGOXIN 0.25 MG  TABS 1/2 tab daily except 1 tab q wed   Dosage confirmed as above?Dosage Confirmed   Supply Requested: 90   Notes: Never recieved from 1.19.12  Medication #2:  ONGLYZA 5 MG TABS 1 once daily ( in place of Janumet as per insurance)   Dosage confirmed as above?Dosage Confirmed   Supply Requested: 90 [facility] [address]  Next Appointment Scheduled: none Initial call taken by: Harold Barban,  February 26, 2010 2:35 PM    New/Updated Medications: ONGLYZA 5 MG TABS (SAXAGLIPTIN HCL) 1 once daily ( in place of Janumet as per insurance)**LABS DUE NOW** Prescriptions: ONGLYZA 5 MG TABS (SAXAGLIPTIN HCL) 1 once daily ( in place of Janumet as per insurance)**LABS DUE NOW**  #30 x 0   Entered by:   Shonna Chock CMA   Authorized by:   Marga Melnick MD   Signed by:   Shonna Chock CMA on 02/27/2010   Method used:   Electronically to        [facility] [phone]* (retail)       [address]       [place], [place]  [address]       Ph: [phone]       Fax: [phone]   RxID:   [id number] DIGOXIN 0.25 MG  TABS (DIGOXIN) 1/2 tab daily except 1 tab q wed  #90 Each x 2   Entered by:   Shonna Chock CMA   Authorized by:   Marga Melnick MD   Signed by:   Shonna Chock CMA on 02/27/2010   Method used:   Electronically to        [facility] [phone]* (retail)       [address]       [place], [place]  [address]       Ph: [phone]       Fax: [phone]   RxID:   [id number]  5)  Please schedule a follow-up LAB  appointment in 2 months for  6)  BUN,creat ,K+,HbgA1C.  Copied from 11/2009 OV patient instruction

## 2010-04-26 ENCOUNTER — Other Ambulatory Visit: Payer: Self-pay | Admitting: Internal Medicine

## 2010-04-26 NOTE — Telephone Encounter (Signed)
Please verify last TSH ; goal = 1-5. TSH needs to be checked every 12 months. Renew until next TSH due

## 2010-04-26 NOTE — Telephone Encounter (Signed)
Copied from 11/22/2009 labs, labs were due 01/22/2010  5)  Please schedule a follow-up LAB  appointment in 2 months for  6)  BUN,creat ,K+,HbgA1C.

## 2010-05-06 ENCOUNTER — Other Ambulatory Visit: Payer: Self-pay | Admitting: Internal Medicine

## 2010-06-03 ENCOUNTER — Other Ambulatory Visit: Payer: Self-pay | Admitting: Internal Medicine

## 2010-06-03 NOTE — Telephone Encounter (Signed)
5)  Please schedule a follow-up LAB  appointment in 2 months for  6)  BUN,creat ,K+,HbgA1C.  Copied from 11/22/2009 OV (Patient instruction)

## 2010-06-21 NOTE — Discharge Summary (Signed)
NAME:  Sierra Fisher, Sierra Fisher                           ACCOUNT NO.:  [id number]   MEDICAL RECORD NO.:  [id number]                   PATIENT TYPE:  INP   LOCATION:  4727                                 FACILITY:  [facility]   PHYSICIAN:  Stacie Glaze, M.D. [facility]           DATE OF BIRTH:  1940/05/14   DATE OF ADMISSION:  06/22/2002  DATE OF DISCHARGE:  06/24/2002                                 DISCHARGE SUMMARY   DISCHARGE DIAGNOSES:  1. Fever.  2. Chills.  3. Nausea and vomiting.  4. Urinary tract infection.   BRIEF ADMISSION HISTORY:  Sierra Fisher is a 70 year old white female who  presented with six days of feeling poorly with symptoms including diffuse  myalgias, fever, nausea, and anorexia.   PAST MEDICAL HISTORY:  1. Adult-onset diabetes mellitus.  2. Hypothyroidism.  3. Hypertension.  4. Hypercholesterolemia.   HOSPITAL COURSE:  1. ID.  The patient presented with a fever and a white count of 20,000.     Urinalysis was consistent with a urinary tract infection; cultures     remained negative.  Blood cultures were also negative.  She also had oral     viral herpetic lesions.  Patient was started on Rocephin, Tamiflu, and     Acyclovir.  Tamiflu was discontinued.  The patient's white count     normalized, and the patient's fever resolved as well.  The patient was     changed to oral antibiotics; we will have her complete a full seven-day     course of Cipro for a UTI and a full five-day course of Acyclovir for     oral herpetic lesions.  2. GI.  Gastritis and anorexia, likely secondary to her urinary tract     infection; this has resolved, and she is currently tolerating a regular     diet.  3. Headache.  Again, likely secondary to systemic infection and dehydration,     also resolved.  4. Adult-onset diabetes mellitus.  The patient presented with uncontrolled     hyperglycemia, likely exacerbated by infection.  This was treated with IV     fluids, and she is much better on her  Glucophage and Actos.  We did note     that her hemoglobin A1c was elevated at 9%.  The patient's Glucotrol has     been resumed.  Diabetes coordinators made recommendations to have the     patient follow up at [facility].  They     also recommended increasing her Glucophage to 1500 mg b.i.d., but we will     defer this to Dr. Alwyn Ren.  5. Hypertriglyceridemia, likely secondary to her diabetes and should improve     with diet and improved diabetic control.  6. Hyponatremia secondary to nausea, vomiting, hyperglycemia, and perhaps     HCTZ that she takes.  7. Hypokalemia, also secondary to nausea, vomiting, and  GI losses as well as     HCTZ; this has improved.   LABORATORY DATA:  Labs at discharge, potassium 3.2, BUN 7, creatinine 0.9,  hemoglobin 10.2, white count was 9.8.   DISCHARGE MEDICATIONS:  1. Resume her home medications, including Accupril 20 mg daily.  2. Glucotrol XL 5 mg two tabs daily.  3. Coreg 25 mg b.i.d.  4. Triamterene/HCTZ 37.5/25 mg every other day.  5. Digoxin 0.25 mg one-half daily.  6. Glucophage 1000 mg b.i.d.  7. Actos 45 mg daily.  8. Levoxyl 125 mcg one and one-half tabs every days except one tab on     Wednesdays.  9. Amitriptyline 50 mg q.h.s.  10.      Cipro 500 mg b.i.d. for four days.  11.      Acyclovir 400 mg five times per day for two days.   FOLLOW UP:  Follow up with Dr. Alwyn Ren in one to two weeks.     Cornell Barman, P.A. [facility]                  Stacie Glaze, M.D. [facility]    LC/MEDQ  D:  06/24/2002  T:  06/24/2002  Job:  [id number]   cc:   Titus Dubin. Alwyn Ren, M.D. [facility]

## 2010-06-21 NOTE — H&P (Signed)
NAME:  Sierra Fisher, Sierra Fisher                           ACCOUNT NO.:  [id number]   MEDICAL RECORD NO.:  [id number]                   PATIENT TYPE:  INP   LOCATION:  4727                                 FACILITY:  [facility]   PHYSICIAN:  Titus Dubin. Alwyn Ren, M.D. [facility]         DATE OF BIRTH:  [birth year]   DATE OF ADMISSION:  06/22/2002  DATE OF DISCHARGE:                                HISTORY & PHYSICAL   HISTORY OF PRESENT ILLNESS:  The patient is a 70 year old diabetic admitted  with uncontrolled diabetes, profound anorexia, with associate weight loss  and nausea and vomiting.   Her symptoms began Jun 17, 2002 as a stiffness and generalized body  soreness, particularly in the hips.  She felt she had fever but did not  take her temperature.  On May 14 she had nausea and vomiting x1.  She has  not been able to take her medications or eat since May 14.  Now she has a  residual headache and stiffness in her hips and back and stomach.  She has  ongoing nausea.  Headache is described as intermittent and throbbing without  radiation and without an associated prodrome or aura.  She describes it as  God-awful over the anterior crown.  She has difficulty defining the nature  of the process but describes it as I feel like something imploded in me.  She has had no GI symptomatology other than nausea, vomiting, and chronic  constipation.  Her urine has been dark and yellow but she denies dysuria or  pyuria.  She has had fever blisters over her lips related to the fever.   She does mention that she had three bites on my rear approximately 10 days  ago.  She was exposed to her son-in-law who died of questionable drug use-  related illness processes.   PAST SURGICAL HISTORY:  1. Cholecystectomy.  2. Total abdominal hysterectomy for infection (she did not have salpingo-     oophorectomy).  3. Breast lump removal.   PAST MEDICAL HISTORY:  1. Diabetes.  2. Hypothyroidism.  3. Congestive heart failure.  4.  Metabolic syndrome.   FAMILY HISTORY:  Limited, as she is adopted.   SOCIAL HISTORY:  She quit smoking in 1977 and she does not drink.   ALLERGIES:  No known drug allergies.   MEDICATIONS:  She has not been taking her medicines for the last week.  She  had been on:  1. Amitriptyline 50 mg at bedtime.  2. Accupril 40 mg daily.  3. Glucotrol XL 5 mg two daily.  4. Coreg 25 mg twice a day.  5. Triamterene/hydrochlorothiazide 37.5/25 every-other day.  6. Digoxin 0.2 one-half daily.  7. Metformin 1000 mg two daily.  8. Baby aspirin.  9. Levoxyl 126 mcg one-and-a-half daily except one on Wednesday.  10.      Actos 45 mg daily.  11.      Supplements including  omega-3 fatty acids.   REVIEW OF SYSTEMS:  As outlined above.  She has had no cardiopulmonary  symptoms of chest pain, palpitations, shortness of breath, cough, or sputum.   PHYSICAL EXAMINATION:  GENERAL:  She appears pale, fatigued, and somewhat  chronically ill.  VITAL SIGNS:  Weight is down 9.5 to 184.  Temperature 98 degrees, pulse 92,  respiratory rate 18, and blood pressure 108/68.  HEENT:  Arteriolar narrowing is noted on fundal exam.  She has no  conjunctival hemorrhages.  Tongue is coated.  She has herpetic lesions over  the lip.  The remainder of otolaryngologic exam is unremarkable.  NEUROLOGIC:  Bilateral neurologic exam is unremarkable.  NECK:  Supple with no meningismus.  Thyroid is normal to palpation.  CHEST:  Clear to auscultation.  HEART:  She has an S4 with a grade 1 systolic murmur.  EXTREMITIES:  Pedal pulses are decreased.  She has no edema.  SKIN:  Pale and dry.  ABDOMEN:  Bowel sounds are decreased and there is dullness in the right  upper quadrant but there is no tenderness or organomegaly.  LYMPHATIC:  No lymphadenopathy was noted.  BREAST, PELVIC, RECTAL:  Not performed as not germane to this admission.  MUSCULOSKELETAL:  Revealed no deficits.  No lesions were noted over the  buttocks.   NEUROPSYCHIATRIC:  Reveals no deficit.  Her affect is flat and appropriate  with the nuclei of illness.   LABORATORY DATA:  A random sugar was 380.   ASSESSMENT AND PLAN:  Plans were made for admission with IV fluids and  parenteral antinausea medications with Glucomander protocol.  She initially  declined hospitalization but after further discussion she agreed to be  admitted.   She was admitted with nausea and abdominal pain with profound anorexia with  approximately 10-pound weight loss over a week.  She has uncontrolled  diabetes and ketoacidosis must be ruled out.   Chest x-ray, urinalysis will be cultured, along with extensive laboratory  profiles as the etiology of this is not clear.  Because of history of  congestive heart failure in the past she will be placed on telemetry.                                               Titus Dubin. Alwyn Ren, M.D. [facility]    [facility]/MEDQ  D:  06/23/2002  T:  06/23/2002  Job:  [id number]

## 2010-06-24 ENCOUNTER — Other Ambulatory Visit: Payer: Self-pay | Admitting: *Deleted

## 2010-06-24 MED ORDER — AMITRIPTYLINE HCL 50 MG PO TABS: 50.0000 mg | ORAL_TABLET | ORAL | Status: DC

## 2010-06-24 MED ORDER — PRAVASTATIN SODIUM 40 MG PO TABS: 40.0000 mg | ORAL_TABLET | Freq: Every day | ORAL | Status: DC

## 2010-07-02 ENCOUNTER — Other Ambulatory Visit: Payer: Self-pay | Admitting: Internal Medicine

## 2010-07-02 MED ORDER — SAXAGLIPTIN HCL 5 MG PO TABS: 5.0000 mg | ORAL_TABLET | Freq: Every day | ORAL | Status: DC

## 2010-07-02 NOTE — Telephone Encounter (Signed)
Copied form 11/22/09 patient instructions. Labs were due 01/2010 5)  Please schedule a follow-up LAB  appointment in 2 months for  6)  BUN,creat ,K+,HbgA1C.

## 2010-07-04 ENCOUNTER — Encounter: Payer: Self-pay | Admitting: Internal Medicine

## 2010-07-04 ENCOUNTER — Ambulatory Visit (INDEPENDENT_AMBULATORY_CARE_PROVIDER_SITE_OTHER): Payer: PRIVATE HEALTH INSURANCE | Admitting: Internal Medicine

## 2010-07-04 DIAGNOSIS — R5381 Other malaise: Secondary | ICD-10-CM

## 2010-07-04 DIAGNOSIS — R198 Other specified symptoms and signs involving the digestive system and abdomen: Secondary | ICD-10-CM

## 2010-07-04 DIAGNOSIS — R5383 Other fatigue: Secondary | ICD-10-CM

## 2010-07-04 DIAGNOSIS — E039 Hypothyroidism, unspecified: Secondary | ICD-10-CM

## 2010-07-04 DIAGNOSIS — IMO0001 Reserved for inherently not codable concepts without codable children: Secondary | ICD-10-CM

## 2010-07-04 DIAGNOSIS — E538 Deficiency of other specified B group vitamins: Secondary | ICD-10-CM

## 2010-07-04 LAB — CBC WITH DIFFERENTIAL/PLATELET
Basophils Absolute: 0.1 10*3/uL (ref 0.0–0.1)
HCT: 32.2 % — ABNORMAL LOW (ref 36.0–46.0)
Lymphs Abs: 2.4 10*3/uL (ref 0.7–4.0)
Monocytes Absolute: 0.9 10*3/uL (ref 0.1–1.0)
Monocytes Relative: 7 % (ref 3.0–12.0)
Neutrophils Relative %: 71 % (ref 43.0–77.0)
Platelets: 265 10*3/uL (ref 150.0–400.0)
RDW: 18.9 % — ABNORMAL HIGH (ref 11.5–14.6)

## 2010-07-04 LAB — HEPATIC FUNCTION PANEL
ALT: 26 U/L (ref 0–35)
Bilirubin, Direct: 0.1 mg/dL (ref 0.0–0.3)
Total Bilirubin: 0.5 mg/dL (ref 0.3–1.2)
Total Protein: 7.2 g/dL (ref 6.0–8.3)

## 2010-07-04 LAB — BASIC METABOLIC PANEL
BUN: 11 mg/dL (ref 6–23)
Creatinine, Ser: 0.8 mg/dL (ref 0.4–1.2)
GFR: 71.28 mL/min (ref >60.00)
Glucose, Bld: 166 mg/dL — ABNORMAL HIGH (ref 70–99)
Potassium: 4.6 mEq/L (ref 3.5–5.1)

## 2010-07-04 LAB — TSH: TSH: 1.55 u[IU]/mL (ref 0.35–5.50)

## 2010-07-04 LAB — HEMOGLOBIN A1C: Hgb A1c MFr Bld: 10.5 % — ABNORMAL HIGH (ref 4.6–6.5)

## 2010-07-04 NOTE — Progress Notes (Signed)
  Subjective:    Patient ID: Sierra Fisher, female    DOB: May 03, 1940, 70 y.o.   MRN: [id number]  HPI  NAUSEA &  DIARRHEA  Onset: ? 2 weeks ago  Time course: stable  Description: loose but not frank diarrhea   Number of stools per day: 4X/ day Previous treatment: none  Vomiting: no  Abdominal Pain: no , but increased gas Weight loss: no  Decreased urine output: no  Lightheadedness: yes, 3 weeks ago only for 1 week  Recent travel history: no    Sick contacts: no    Suspicious food or water: no, also no specific food triggers  Change in diet: no  Melena / BRRB: no  Red Flags Fever: no  Bloody stools: no  Recent antibiotics: no, but Onglyza started 3-4 mos ago  Immuncompromised: no but DM  PMH of Colitis ; negative on most recent  Colonoscopy     Review of Systems Dry mouth & difficulty swallowing due to dry throat in past 6 months w/o dysphagia. FBS < 135; 2 hrs post meal < 200. Fatigue for 4 mos     Objective:   Physical Exam General appearance is one of good health and nourishment w/o distress.  Eyes: No conjunctival inflammation or scleral icterus is present. Neck : supple ; thyroid normal.  Oral exam: Dental hygiene is good; lips and gums are healthy appearing.There is no oropharyngeal erythema or exudate noted.   Heart:  Normal rate and regular rhythm. S1 and S2 normal without gallop,  click, rub or other extra sounds . Grade 1/6 systolic murmur    Lungs:Chest clear to auscultation; no wheezes, rhonchi,rales ,or rubs present.No increased work of breathing.   Abdomen: bowel sounds normal, soft and non-tender without masses, organomegaly or hernias noted.  No guarding or rebound   Skin:Warm & dry.  Intact without suspicious lesions or rashes ; no jaundice or tenting  Lymphatic: No lymphadenopathy is noted about the head, neck, or axilla.  DTRs 1/2 +; oriented X 3          Assessment & Plan: [id number]        #1 bowel changes without true diarrhea.  #2 fatigue  #3 diabetes questionable control  #4 past medical history B12 deficiency  Plan: See labs and orders.

## 2010-07-04 NOTE — Patient Instructions (Signed)
Please take Align daily if bowels abnormal.

## 2010-07-08 LAB — IBC PANEL
Iron: 22 ug/dL — ABNORMAL LOW (ref 42–145)
Saturation Ratios: 4.1 % — ABNORMAL LOW (ref 20.0–50.0)
Transferrin: 381.9 mg/dL — ABNORMAL HIGH (ref 212.0–360.0)

## 2010-08-27 ENCOUNTER — Encounter: Payer: Self-pay | Admitting: Internal Medicine

## 2010-09-15 ENCOUNTER — Emergency Department (HOSPITAL_COMMUNITY)
Admission: EM | Admit: 2010-09-15 | Discharge: 2010-09-15 | Disposition: A | Discharge status: Home / Self Care | Payer: PRIVATE HEALTH INSURANCE | Attending: Emergency Medicine | Admitting: Emergency Medicine

## 2010-09-15 ENCOUNTER — Emergency Department (HOSPITAL_COMMUNITY): Payer: PRIVATE HEALTH INSURANCE

## 2010-09-15 DIAGNOSIS — M545 Low back pain, unspecified: Secondary | ICD-10-CM | POA: Insufficient documentation

## 2010-09-15 DIAGNOSIS — M5430 Sciatica, unspecified side: Secondary | ICD-10-CM | POA: Insufficient documentation

## 2010-09-15 DIAGNOSIS — R0682 Tachypnea, not elsewhere classified: Secondary | ICD-10-CM | POA: Insufficient documentation

## 2010-09-15 DIAGNOSIS — Z794 Long term (current) use of insulin: Secondary | ICD-10-CM | POA: Insufficient documentation

## 2010-09-15 DIAGNOSIS — R262 Difficulty in walking, not elsewhere classified: Secondary | ICD-10-CM | POA: Insufficient documentation

## 2010-09-15 DIAGNOSIS — I10 Essential (primary) hypertension: Secondary | ICD-10-CM | POA: Insufficient documentation

## 2010-09-15 DIAGNOSIS — Z9089 Acquired absence of other organs: Secondary | ICD-10-CM | POA: Insufficient documentation

## 2010-09-15 DIAGNOSIS — E119 Type 2 diabetes mellitus without complications: Secondary | ICD-10-CM | POA: Insufficient documentation

## 2010-09-17 ENCOUNTER — Other Ambulatory Visit: Payer: Self-pay | Admitting: Internal Medicine

## 2010-09-18 ENCOUNTER — Telehealth: Payer: Self-pay

## 2010-09-18 NOTE — Telephone Encounter (Signed)
a1c 250.02 

## 2010-09-18 NOTE — Telephone Encounter (Signed)
Spoke with patient, we received refill request for metformin, per last A1c check patient to f/u with Endo (Dr.Balan), patient states she prefers Dr.Hopper manage DM, it is more expensive for her to see Dr.Balan and she did not benefit from seeing Dr.Balan in the past (FYI)

## 2010-11-25 ENCOUNTER — Other Ambulatory Visit: Payer: Self-pay | Admitting: Internal Medicine

## 2010-12-10 ENCOUNTER — Other Ambulatory Visit: Payer: Self-pay | Admitting: Internal Medicine

## 2010-12-10 NOTE — Telephone Encounter (Signed)
A1C 250.02

## 2010-12-11 ENCOUNTER — Other Ambulatory Visit: Payer: Self-pay | Admitting: Internal Medicine

## 2010-12-11 MED ORDER — TRIAMTERENE-HCTZ 37.5-25 MG PO TABS: 1.0000 | ORAL_TABLET | Freq: Every day | ORAL | Status: DC

## 2010-12-11 NOTE — Telephone Encounter (Signed)
Rx sent 

## 2010-12-23 ENCOUNTER — Other Ambulatory Visit: Payer: Self-pay | Admitting: Internal Medicine

## 2011-01-06 ENCOUNTER — Telehealth: Payer: Self-pay | Admitting: Internal Medicine

## 2011-01-06 DIAGNOSIS — E785 Hyperlipidemia, unspecified: Secondary | ICD-10-CM

## 2011-01-06 MED ORDER — CARVEDILOL 25 MG PO TABS: ORAL_TABLET | ORAL | Status: DC

## 2011-01-06 MED ORDER — PRAVASTATIN SODIUM 40 MG PO TABS: 40.0000 mg | ORAL_TABLET | Freq: Every day | ORAL | Status: DC

## 2011-01-06 NOTE — Telephone Encounter (Signed)
Patient needs refill carvedilol - pravastatin patient needs 90 day supply  - [facility] [place]

## 2011-01-06 NOTE — Telephone Encounter (Signed)
Lipid/Hep 272.4/995.20, future order placed

## 2011-01-13 ENCOUNTER — Telehealth: Payer: Self-pay | Admitting: Internal Medicine

## 2011-01-13 MED ORDER — METFORMIN HCL 1000 MG PO TABS: ORAL_TABLET | ORAL | Status: DC

## 2011-01-13 NOTE — Telephone Encounter (Signed)
Patient needs refill metformin 1000mg  - 2 pills a day - 90 day suppy [facility] [place]

## 2011-01-13 NOTE — Telephone Encounter (Signed)
Patient will need to schedule A1c 250.00 prior to next refill request

## 2011-02-17 ENCOUNTER — Other Ambulatory Visit: Payer: Self-pay | Admitting: Internal Medicine

## 2011-03-31 ENCOUNTER — Other Ambulatory Visit: Payer: Self-pay | Admitting: Internal Medicine

## 2011-03-31 NOTE — Telephone Encounter (Signed)
A1C 250.00 

## 2011-04-28 ENCOUNTER — Other Ambulatory Visit: Payer: Self-pay | Admitting: Internal Medicine

## 2011-04-28 NOTE — Telephone Encounter (Signed)
Refill done.  

## 2011-05-12 ENCOUNTER — Other Ambulatory Visit: Payer: Self-pay | Admitting: Internal Medicine

## 2011-05-13 NOTE — Telephone Encounter (Signed)
Patient needs to schedule  CPX  

## 2011-05-28 ENCOUNTER — Other Ambulatory Visit (INDEPENDENT_AMBULATORY_CARE_PROVIDER_SITE_OTHER): Payer: PRIVATE HEALTH INSURANCE

## 2011-05-28 DIAGNOSIS — E785 Hyperlipidemia, unspecified: Secondary | ICD-10-CM

## 2011-05-28 LAB — LIPID PANEL
Cholesterol: 138 mg/dL (ref 0–200)
Total CHOL/HDL Ratio: 3
VLDL: 58.6 mg/dL — ABNORMAL HIGH (ref 0.0–40.0)

## 2011-05-28 LAB — HEPATIC FUNCTION PANEL
AST: 24 U/L (ref 0–37)
Albumin: 3.6 g/dL (ref 3.5–5.2)
Alkaline Phosphatase: 65 U/L (ref 39–117)
Total Bilirubin: 0 mg/dL — ABNORMAL LOW (ref 0.3–1.2)

## 2011-05-28 LAB — LDL CHOLESTEROL, DIRECT: Direct LDL: 68.4 mg/dL

## 2011-05-28 NOTE — Progress Notes (Signed)
LABS ONLY  

## 2011-06-05 ENCOUNTER — Other Ambulatory Visit (INDEPENDENT_AMBULATORY_CARE_PROVIDER_SITE_OTHER): Payer: PRIVATE HEALTH INSURANCE

## 2011-06-05 DIAGNOSIS — IMO0001 Reserved for inherently not codable concepts without codable children: Secondary | ICD-10-CM

## 2011-06-05 DIAGNOSIS — E785 Hyperlipidemia, unspecified: Secondary | ICD-10-CM

## 2011-06-05 LAB — BASIC METABOLIC PANEL
BUN: 12 mg/dL (ref 6–23)
CO2: 26 mEq/L (ref 19–32)
Chloride: 96 mEq/L (ref 96–112)
Creatinine, Ser: 0.9 mg/dL (ref 0.4–1.2)
Potassium: 3.3 mEq/L — ABNORMAL LOW (ref 3.5–5.1)

## 2011-06-05 LAB — MICROALBUMIN / CREATININE URINE RATIO
Creatinine,U: 178.9 mg/dL
Microalb Creat Ratio: 4.4 mg/g (ref 0.0–30.0)

## 2011-06-05 LAB — TSH: TSH: 4.17 u[IU]/mL (ref 0.35–5.50)

## 2011-06-05 LAB — HEMOGLOBIN A1C: Hgb A1c MFr Bld: 11 % — ABNORMAL HIGH (ref 4.6–6.5)

## 2011-06-05 NOTE — Progress Notes (Signed)
Lab only 

## 2011-06-10 ENCOUNTER — Ambulatory Visit (INDEPENDENT_AMBULATORY_CARE_PROVIDER_SITE_OTHER): Payer: PRIVATE HEALTH INSURANCE | Admitting: Internal Medicine

## 2011-06-10 ENCOUNTER — Encounter: Payer: Self-pay | Admitting: Internal Medicine

## 2011-06-10 VITALS — BP 132/80 | HR 106 | Wt 183.0 lb

## 2011-06-10 DIAGNOSIS — E876 Hypokalemia: Secondary | ICD-10-CM

## 2011-06-10 DIAGNOSIS — I10 Essential (primary) hypertension: Secondary | ICD-10-CM

## 2011-06-10 DIAGNOSIS — H109 Unspecified conjunctivitis: Secondary | ICD-10-CM

## 2011-06-10 DIAGNOSIS — E039 Hypothyroidism, unspecified: Secondary | ICD-10-CM

## 2011-06-10 DIAGNOSIS — E538 Deficiency of other specified B group vitamins: Secondary | ICD-10-CM

## 2011-06-10 DIAGNOSIS — G478 Other sleep disorders: Secondary | ICD-10-CM

## 2011-06-10 DIAGNOSIS — G479 Sleep disorder, unspecified: Secondary | ICD-10-CM

## 2011-06-10 DIAGNOSIS — J310 Chronic rhinitis: Secondary | ICD-10-CM

## 2011-06-10 DIAGNOSIS — IMO0001 Reserved for inherently not codable concepts without codable children: Secondary | ICD-10-CM

## 2011-06-10 DIAGNOSIS — E785 Hyperlipidemia, unspecified: Secondary | ICD-10-CM

## 2011-06-10 DIAGNOSIS — H1010 Acute atopic conjunctivitis, unspecified eye: Secondary | ICD-10-CM

## 2011-06-10 LAB — CBC WITH DIFFERENTIAL/PLATELET
Basophils Relative: 0.7 % (ref 0.0–3.0)
Eosinophils Absolute: 0.3 10*3/uL (ref 0.0–0.7)
Eosinophils Relative: 1.9 % (ref 0.0–5.0)
HCT: 33.6 % — ABNORMAL LOW (ref 36.0–46.0)
Hemoglobin: 10.5 g/dL — ABNORMAL LOW (ref 12.0–15.0)
Lymphs Abs: 3 10*3/uL (ref 0.7–4.0)
MCHC: 31.1 g/dL (ref 30.0–36.0)
MCV: 67.9 fl — ABNORMAL LOW (ref 78.0–100.0)
Monocytes Absolute: 1.1 10*3/uL — ABNORMAL HIGH (ref 0.1–1.0)
Neutro Abs: 9 10*3/uL — ABNORMAL HIGH (ref 1.4–7.7)
Neutrophils Relative %: 67.1 % (ref 43.0–77.0)
RBC: 4.95 Mil/uL (ref 3.87–5.11)

## 2011-06-10 MED ORDER — CHLORTHALIDONE 25 MG PO TABS: ORAL_TABLET | ORAL | Status: DC

## 2011-06-10 NOTE — Assessment & Plan Note (Signed)
BP controlled but K+ 3.3

## 2011-06-10 NOTE — Patient Instructions (Signed)
Plain Mucinex for thick secretions ;force NON dairy fluids . Use a Neti pot daily as needed for sinus congestion; going from open side to congested side . Nasal cleansing in the shower as discussed. Make sure that all residual soap is removed to prevent irritation. OTC Zyrtec 10 mg @ bedtime   The most common cause of elevated triglycerides is the ingestion of sugar from high fructose corn syrup sources added to processed foods & drinks.  Eat a low-fat diet with lots of fruits and vegetables, up to 7-9 servings per day. Consume less than 30 (preferably ZERO) grams of sugar per day from foods & drinks with High Fructose Corn Syrup (HFCS) sugar as #1,2,3 or # 4 on label.Whole Foods, [employer] & [employer] do not carry products with HFCS. Follow a  low carb nutrition program such as [place] or [employer]  to prevent Diabetes progression . White carbohydrates (potatoes, rice, bread, and pasta) have a high spike of sugar and a high load of sugar. For example a  baked potato has a cup of sugar and a  french fry  2 teaspoons of sugar. Yams, wild  rice, whole grained bread &  wheat pasta have been much lower spike and load of  sugar. Portions should be the size of a deck of cards or your palm.  Please try to go on My Chart within the next 24 hours to allow me to release the results directly to you.

## 2011-06-10 NOTE — Assessment & Plan Note (Signed)
TSH 4.17; monitor every 6 mos

## 2011-06-10 NOTE — Progress Notes (Signed)
Subjective:    Patient ID: Sierra Fisher, female    DOB: 10-28-1940, 71 y.o.   MRN: [id number]  HPI Diabetes status assessment: Fasting or morning glucose  average :  210 . Highest glucose 2 hours after any meal:  240. Hypoglycemia :  rarely.                                                     Excess thirst :yes;  Excess hunger: yes ;  Excess urination: yes.                                  Lightheadedness with standing:  yes. Chest pain: no ; Palpitations :no ;  Pain in  calves with walking:  no .                                                                                                                                 Non healing skin  ulcers or sores,especially over the feet:  no. Numbness or tingling or burning in feet : numbness .                                                                                                                                              Significant change in  Weight :stable Vision changes : Dr Elmer Picker, Ophth  seen   1/13; no retinopathy .                                                                    Exercise : no . Nutrition/diet: no. Medication compliance : Onglyza  Rx never received . Medication adverse  Effects: no . Foot care : no.  A1c/ urine microalbumin monitor: 11% /7.9 urine microalb , down from 17.4     Review of Systems   Insomnia Onset:6 mos Pattern:intermittent Difficulty going to  sleep:yes Frequent awakening:yes Early awakening:yes Nightmares:no Abnormal leg movement:no RLS symptoms Snoring:yes Apnea:no Risk factors/sleep hygiene: Stimulants: Alcohol intake:no Reading, watching TV, eating in ZOX:WRUEA Daytime naps:occasionally Stress/anxiety: ? Work/travel factors:no Impact: Daytime hypersomnolence: yes Motor vehicle accident/motor dysfunction: no Treatment to date/efficacy: Benadryl helps  Recent allergy flares with itchy eyes, throat & ears tickling  B12 not continued after last Rx; taking OTC B12         Objective:   Physical Exam Gen.: Healthy and well-nourished in appearance. Alert, appropriate and cooperative throughout exam.   Eyes: No corneal or conjunctival inflammation noted. No lid lag or proptosis. Extraocular motion intact. Vision grossly normal.   Mouth: Oral mucosa and oropharynx reveal no lesions or exudates. Teeth in good repair. Slight oropharyngeal crowding Neck: No deformities, masses, or tenderness noted. Range of motion & Thyroid normal. Lungs: Normal respiratory effort; chest expands symmetrically. Lungs are clear to auscultation without rales, wheezes, or increased work of breathing. Heart: Normal rate and rhythm. Normal S1 and S2. No gallop, click, or rub. Grade 1/6 systolic murmur . Abdomen: Bowel sounds normal; abdomen soft and nontender. No masses, organomegaly or hernias noted. Genitalia: needs Gyn                                  Musculoskeletal/extremities: No deformity or scoliosis noted of  the thoracic or lumbar spine. No clubbing, cyanosis, edema, or deformity noted. Range of motion  normal .Tone & strength  normal.Joints normal. Nail health  good. Vascular: Carotid, radial artery, dorsalis pedis and  posterior tibial pulses are full and equal. No bruits present. Neurologic: Alert and oriented x3. Deep tendon reflexes symmetrical and normal.  Light touch normal over feet.        Skin: Intact without suspicious lesions or rashes. Lymph: No cervical, axillary lymphadenopathy present. Psych: Mood and affect are normal. Normally interactive                                                                                         Assessment & Plan:

## 2011-06-10 NOTE — Assessment & Plan Note (Signed)
TG 293; LDL 68.4.Nutrition discussed

## 2011-06-10 NOTE — Assessment & Plan Note (Signed)
A1c 11% = average sugar 269 & risk 120 % increased

## 2011-06-10 NOTE — Assessment & Plan Note (Signed)
Off shots > 2 mos

## 2011-06-16 ENCOUNTER — Other Ambulatory Visit: Payer: Self-pay | Admitting: Internal Medicine

## 2011-07-08 ENCOUNTER — Encounter: Payer: Self-pay | Admitting: Internal Medicine

## 2011-07-13 ENCOUNTER — Other Ambulatory Visit: Payer: Self-pay | Admitting: Internal Medicine

## 2011-07-14 NOTE — Telephone Encounter (Signed)
Rx sent 

## 2011-07-28 ENCOUNTER — Other Ambulatory Visit: Payer: Self-pay | Admitting: Internal Medicine

## 2011-08-12 ENCOUNTER — Other Ambulatory Visit: Payer: Self-pay | Admitting: Internal Medicine

## 2011-08-19 ENCOUNTER — Other Ambulatory Visit: Payer: Self-pay | Admitting: Internal Medicine

## 2011-08-19 ENCOUNTER — Telehealth: Payer: Self-pay | Admitting: *Deleted

## 2011-08-19 DIAGNOSIS — E876 Hypokalemia: Secondary | ICD-10-CM

## 2011-08-19 DIAGNOSIS — I10 Essential (primary) hypertension: Secondary | ICD-10-CM

## 2011-08-19 MED ORDER — CHLORTHALIDONE 25 MG PO TABS: ORAL_TABLET | ORAL | Status: DC

## 2011-08-19 NOTE — Telephone Encounter (Signed)
TRIAGE MESSAGE:called 7.16.13 840am Caller: Noreta   wants rx for Chlorthalidone  90-day supply, tired of running to pharmacy every month  Call back# [phone]  Last ov 5.7.13  Last wrt 5.7.13 #30, Chlorthalidone (Tab) HYGROTON 25 MG K+ 3.3 on HCTZ combination

## 2011-08-19 NOTE — Telephone Encounter (Signed)
DUPLICATE REQUEST, RX WAS ALREADY RESPONDED TO

## 2011-08-19 NOTE — Telephone Encounter (Signed)
RX sent

## 2011-08-19 NOTE — Telephone Encounter (Signed)
Pt called in to request a refill for her Hygroton to be sent to [facility] [place] per notes she is almost out and last refill was noted as only #30 and now would like to request a 90 day supply to be sent in for her

## 2011-10-28 ENCOUNTER — Other Ambulatory Visit: Payer: Self-pay | Admitting: Internal Medicine

## 2011-10-28 NOTE — Telephone Encounter (Signed)
Rx sent.    MW 

## 2011-11-17 ENCOUNTER — Other Ambulatory Visit: Payer: Self-pay | Admitting: Internal Medicine

## 2011-11-18 NOTE — Telephone Encounter (Signed)
A1C 250.02 

## 2011-11-24 ENCOUNTER — Other Ambulatory Visit: Payer: Self-pay | Admitting: Internal Medicine

## 2011-12-14 ENCOUNTER — Encounter: Payer: Self-pay | Admitting: Internal Medicine

## 2011-12-15 ENCOUNTER — Other Ambulatory Visit: Payer: Self-pay | Admitting: Internal Medicine

## 2011-12-15 ENCOUNTER — Telehealth: Payer: Self-pay | Admitting: Internal Medicine

## 2011-12-15 NOTE — Telephone Encounter (Signed)
Patient will have to have labs and OV follow-up prior to additional refills per MD

## 2011-12-15 NOTE — Telephone Encounter (Signed)
called regarding qty on her Metformin, reviewed chart advised pt is over dur for labs/OV per last refill -- pt coming in tomorrow for labs only will call back to schedule her OV, as she is a transporter for a chemo patient and does not have her schedule for next week. Pt stated she will call in her metformin today but would like a refill for a 90-day supply, if possible

## 2011-12-16 ENCOUNTER — Encounter (INDEPENDENT_AMBULATORY_CARE_PROVIDER_SITE_OTHER): Payer: PRIVATE HEALTH INSURANCE

## 2011-12-16 ENCOUNTER — Other Ambulatory Visit: Payer: PRIVATE HEALTH INSURANCE

## 2011-12-16 DIAGNOSIS — T887XXA Unspecified adverse effect of drug or medicament, initial encounter: Secondary | ICD-10-CM

## 2011-12-16 DIAGNOSIS — E785 Hyperlipidemia, unspecified: Secondary | ICD-10-CM

## 2011-12-16 DIAGNOSIS — IMO0001 Reserved for inherently not codable concepts without codable children: Secondary | ICD-10-CM

## 2011-12-16 DIAGNOSIS — E119 Type 2 diabetes mellitus without complications: Secondary | ICD-10-CM

## 2011-12-16 LAB — HEPATIC FUNCTION PANEL
ALT: 23 U/L (ref 0–35)
AST: 25 U/L (ref 0–37)
Albumin: 3.6 g/dL (ref 3.5–5.2)
Alkaline Phosphatase: 54 U/L (ref 39–117)
Bilirubin, Direct: 0.1 mg/dL (ref 0.0–0.3)
Total Bilirubin: 0.3 mg/dL (ref 0.3–1.2)
Total Protein: 7.2 g/dL (ref 6.0–8.3)

## 2011-12-16 LAB — BASIC METABOLIC PANEL
CO2: 28 mEq/L (ref 19–32)
Calcium: 9.9 mg/dL (ref 8.4–10.5)
Chloride: 95 mEq/L — ABNORMAL LOW (ref 96–112)
Glucose, Bld: 234 mg/dL — ABNORMAL HIGH (ref 70–99)
Sodium: 133 mEq/L — ABNORMAL LOW (ref 135–145)

## 2011-12-16 LAB — LIPID PANEL
HDL: 36.3 mg/dL — ABNORMAL LOW (ref >39.00)
Triglycerides: 319 mg/dL — ABNORMAL HIGH (ref 0.0–149.0)
VLDL: 63.8 mg/dL — ABNORMAL HIGH (ref 0.0–40.0)

## 2011-12-16 NOTE — Telephone Encounter (Signed)
Pt had labs at [facility] today and is scheduled to see you next week

## 2011-12-16 NOTE — Telephone Encounter (Signed)
60

## 2011-12-16 NOTE — Telephone Encounter (Signed)
Pt hasn't had OV/ labs since 06/10/11 Plz schedule before refill       MW

## 2011-12-19 NOTE — Progress Notes (Signed)
This encounter was created in error - please disregard.

## 2011-12-24 LAB — HM DIABETES EYE EXAM

## 2011-12-25 ENCOUNTER — Telehealth: Payer: Self-pay | Admitting: Internal Medicine

## 2011-12-25 NOTE — Telephone Encounter (Signed)
Message copied by Marshell Garfinkel on Thu Dec 25, 2011  8:58 AM ------      Message from: Pecola Lawless      Created: Wed Dec 24, 2011  6:33 PM       Please verify followup appointment . Please ask her bring all actual pill bottles and supplements.Please write the name of the prescribing physician to the right of the medication (including insulin) to  appointment. This will help provide continuity of care; help optimize therapeutic interventions;and help prevent drug:drug adverse reaction.

## 2011-12-25 NOTE — Telephone Encounter (Signed)
Lmovm for pt to call office. °

## 2011-12-26 ENCOUNTER — Ambulatory Visit (INDEPENDENT_AMBULATORY_CARE_PROVIDER_SITE_OTHER): Payer: PRIVATE HEALTH INSURANCE | Admitting: Internal Medicine

## 2011-12-26 ENCOUNTER — Encounter: Payer: Self-pay | Admitting: Internal Medicine

## 2011-12-26 VITALS — BP 124/82 | HR 110 | Resp 13 | Wt 188.8 lb

## 2011-12-26 DIAGNOSIS — E785 Hyperlipidemia, unspecified: Secondary | ICD-10-CM

## 2011-12-26 DIAGNOSIS — IMO0001 Reserved for inherently not codable concepts without codable children: Secondary | ICD-10-CM

## 2011-12-26 DIAGNOSIS — G479 Sleep disorder, unspecified: Secondary | ICD-10-CM | POA: Insufficient documentation

## 2011-12-26 DIAGNOSIS — I10 Essential (primary) hypertension: Secondary | ICD-10-CM

## 2011-12-26 DIAGNOSIS — E039 Hypothyroidism, unspecified: Secondary | ICD-10-CM

## 2011-12-26 MED ORDER — ALPRAZOLAM 0.25 MG PO TABS: ORAL_TABLET | ORAL | Status: DC

## 2011-12-26 NOTE — Patient Instructions (Addendum)
Please review the medication list in the After Visit Summary provided.Please write the name of the prescribing physician to the right of the medication and share this with all medical staff seen at each appointment. This will help provide continuity of care; help optimize therapeutic interventions;and help prevent drug:drug adverse reaction.  Review and correct the record as indicated. Please share record with all medical staff seen.   If you activate My Chart; the results can be released to you as soon as they populate from the lab. If you choose not to use this program; the labs have to be reviewed, copied & mailed   causing a delay in getting the results to you.  

## 2011-12-26 NOTE — Assessment & Plan Note (Signed)
Dr Lucianne Muss is managing her diabetes and has chosen to employ 70/30 as well as Lantus insulin.

## 2011-12-26 NOTE — Telephone Encounter (Signed)
Spoke w/pt and advised her of Dr. Frederik Pear instructions. Pt understood.

## 2011-12-26 NOTE — Assessment & Plan Note (Addendum)
Blood pressure is well controlled. She is on carvedilol which will have cardiac protection. There is no clinical evidence of cardiac decompensation. I believe that she could be weaned from the digitalis; believe that the benefit of doing this outweighs any risk.  If additional antihypertensive as needed consideration could be given to low-dose ramipril which would also have cardiac protection.

## 2011-12-26 NOTE — Progress Notes (Signed)
  Subjective:    Patient ID: Sierra Fisher, female    DOB: [birth year], 71 y.o.   MRN: [id number]  HPI [facility] had sent notification that she was taking Lantus as well as Novolin 70/30 from 2 separate physicians. Actually both the Novolin  70/30 & the Lantus are prescribed by Dr. Lucianne Muss, Endocrinologist.  Of concern would be the polypharmacy at her age; some of the medications represent much older formulations which may not be necessary and may have increased risk of adverse drug: Drug interaction.  Her labs were reviewed. Her triglycerides are 319; the pathophysiology of elevated triglycerides do to the ingestion of High Fructose Corn Syrup sugar was discussed. As expected with hypertriglyceridemia , her HDL is low at 36.3. Liver functions are normal.  Sodium is 133; however her glucose is 234.  TSH is therapeutic at 1.97. Her direct LDL is excellent at 63.2.  She has increased her exercise and as walking 30 minutes daily. She does try to avoid high fructose corn syrup in her diet.    Review of Systems  She has been compliant with her statin. She has alternating constipation and diarrhea. She is not having significant myalgias.  She has been on digitalis for decades. She is now on carvedilol. She has some exertional dyspnea, mainly related to cold exposure. She will have edema occasionally. She denies paroxysmal nocturnal dyspnea.       Objective:   Physical Exam General appearance is one of good health and nourishment w/o distress.Appears younger than stated age   Eyes: No conjunctival inflammation or scleral icterus is present.  Neck: Thyroid normal to palpation. No neck vein distention at 5  Oral exam: Dental hygiene is good; lips and gums are healthy appearing.There is no oropharyngeal erythema or exudate noted.   Heart:  Normal rate and regular rhythm. S1 and S2 normal without gallop,  click, rub or other extra sounds.Grade 1/6 systolic murmur    Lungs:Chest clear to  auscultation; no wheezes, rhonchi,rales ,or rubs present.No increased work of breathing.   Abdomen: bowel sounds normal, soft and non-tender without masses, organomegaly or hernias noted.  No guarding or rebound . No hepatojugular reflux  Skin:Warm & dry.  Intact without suspicious lesions or rashes   Lymphatic: No lymphadenopathy is noted about the head, neck, axilla.              Assessment & Plan:

## 2011-12-26 NOTE — Assessment & Plan Note (Signed)
TSH was therapeutic; no change in dose of medication

## 2011-12-26 NOTE — Assessment & Plan Note (Signed)
The LDL is at goal; a major issue at the elevated triglycerides. Nutritional interventions discussed.

## 2011-12-29 ENCOUNTER — Other Ambulatory Visit: Payer: Self-pay | Admitting: Internal Medicine

## 2011-12-29 NOTE — Telephone Encounter (Signed)
Refills that were to be sent for patient- she has called and wants to make sure that they are being sent off for 90 days.

## 2011-12-30 NOTE — Telephone Encounter (Signed)
Rx sent.    MW 

## 2011-12-31 ENCOUNTER — Other Ambulatory Visit: Payer: Self-pay

## 2011-12-31 MED ORDER — LEVOTHYROXINE SODIUM 125 MCG PO TABS: ORAL_TABLET | ORAL | Status: DC

## 2011-12-31 MED ORDER — METFORMIN HCL 1000 MG PO TABS: ORAL_TABLET | ORAL | Status: DC

## 2011-12-31 MED ORDER — CHLORTHALIDONE 25 MG PO TABS: ORAL_TABLET | ORAL | Status: DC

## 2011-12-31 MED ORDER — CYANOCOBALAMIN 1000 MCG/ML IJ SOLN: INTRAMUSCULAR | Status: DC

## 2011-12-31 MED ORDER — CARVEDILOL 25 MG PO TABS: ORAL_TABLET | ORAL | Status: DC

## 2011-12-31 MED ORDER — PRAVASTATIN SODIUM 40 MG PO TABS: ORAL_TABLET | ORAL | Status: DC

## 2011-12-31 NOTE — Telephone Encounter (Signed)
RXs sent.

## 2012-06-29 ENCOUNTER — Other Ambulatory Visit: Payer: Self-pay | Admitting: Internal Medicine

## 2012-06-30 NOTE — Telephone Encounter (Signed)
Left message on voicemail for patient to return call when available, reason for call (not left on voicemail): inform patient of newly developed process for controlled substances. Patient will have to stop by the office and sign contract and pick-up rx

## 2012-07-01 NOTE — Telephone Encounter (Signed)
Patient aware Controlled Substance Contract to be sign and rx to be picked up   

## 2012-07-18 ENCOUNTER — Encounter: Payer: Self-pay | Admitting: Internal Medicine

## 2012-08-04 ENCOUNTER — Telehealth: Payer: Self-pay | Admitting: *Deleted

## 2012-08-04 NOTE — Telephone Encounter (Signed)
Pt called requesting a refill of her potassium chloride to be sent to [facility] at [facility]. Please advise.

## 2012-08-04 NOTE — Telephone Encounter (Signed)
This can be done only if she makes a follow up appointment, has been overdue for several months

## 2012-08-05 ENCOUNTER — Telehealth: Payer: Self-pay | Admitting: Endocrinology

## 2012-08-05 NOTE — Telephone Encounter (Signed)
Pt called to refill Potassium rx, informed pt that she should need an office visit before it could be refilled. I transferred called to scheduling and pt hung up without scheduling appt.

## 2012-09-28 ENCOUNTER — Other Ambulatory Visit: Payer: Self-pay | Admitting: Internal Medicine

## 2012-10-13 ENCOUNTER — Telehealth: Payer: Self-pay | Admitting: Internal Medicine

## 2012-10-13 NOTE — Telephone Encounter (Signed)
Patient stated they are due for her lab work. Please advise on orders. Patient is scheduled for tomorrow.

## 2012-10-14 ENCOUNTER — Other Ambulatory Visit (INDEPENDENT_AMBULATORY_CARE_PROVIDER_SITE_OTHER): Payer: PRIVATE HEALTH INSURANCE

## 2012-10-14 DIAGNOSIS — IMO0001 Reserved for inherently not codable concepts without codable children: Secondary | ICD-10-CM

## 2012-10-14 DIAGNOSIS — D649 Anemia, unspecified: Secondary | ICD-10-CM

## 2012-10-14 NOTE — Telephone Encounter (Signed)
Pt was seen today and lab order was given.//AB/CMA

## 2012-10-15 LAB — CBC WITH DIFFERENTIAL/PLATELET
Basophils Relative: 0.8 % (ref 0.0–3.0)
Eosinophils Relative: 2.5 % (ref 0.0–5.0)
Hemoglobin: 10.1 g/dL — ABNORMAL LOW (ref 12.0–15.0)
Lymphs Abs: 1.7 10*3/uL (ref 0.7–4.0)
MCHC: 31.4 g/dL (ref 30.0–36.0)
Monocytes Absolute: 0.5 10*3/uL (ref 0.1–1.0)
Monocytes Relative: 5.3 % (ref 3.0–12.0)
Neutrophils Relative %: 73.2 % (ref 43.0–77.0)
Platelets: 239 10*3/uL (ref 150.0–400.0)
RDW: 17.9 % — ABNORMAL HIGH (ref 11.5–14.6)

## 2012-10-15 LAB — HEMOGLOBIN A1C: Hgb A1c MFr Bld: 7.6 % — ABNORMAL HIGH (ref 4.6–6.5)

## 2012-10-16 LAB — IRON AND TIBC
%SAT: 6 % — ABNORMAL LOW (ref 20–55)
TIBC: 432 ug/dL (ref 250–470)
UIBC: 408 ug/dL — ABNORMAL HIGH (ref 125–400)

## 2012-10-19 ENCOUNTER — Ambulatory Visit (INDEPENDENT_AMBULATORY_CARE_PROVIDER_SITE_OTHER): Payer: PRIVATE HEALTH INSURANCE | Admitting: Internal Medicine

## 2012-10-19 ENCOUNTER — Encounter: Payer: Self-pay | Admitting: Internal Medicine

## 2012-10-19 VITALS — BP 181/85 | HR 106 | Temp 98.6°F | Wt 208.6 lb

## 2012-10-19 DIAGNOSIS — E039 Hypothyroidism, unspecified: Secondary | ICD-10-CM

## 2012-10-19 DIAGNOSIS — E785 Hyperlipidemia, unspecified: Secondary | ICD-10-CM

## 2012-10-19 DIAGNOSIS — Z8719 Personal history of other diseases of the digestive system: Secondary | ICD-10-CM

## 2012-10-19 DIAGNOSIS — M2550 Pain in unspecified joint: Secondary | ICD-10-CM

## 2012-10-19 DIAGNOSIS — I10 Essential (primary) hypertension: Secondary | ICD-10-CM

## 2012-10-19 DIAGNOSIS — D509 Iron deficiency anemia, unspecified: Secondary | ICD-10-CM | POA: Insufficient documentation

## 2012-10-19 DIAGNOSIS — E538 Deficiency of other specified B group vitamins: Secondary | ICD-10-CM

## 2012-10-19 DIAGNOSIS — IMO0001 Reserved for inherently not codable concepts without codable children: Secondary | ICD-10-CM

## 2012-10-19 MED ORDER — TRAMADOL HCL 50 MG PO TABS
50.0000 mg | ORAL_TABLET | Freq: Three times a day (TID) | ORAL | Status: DC | PRN
Start: 2012-10-19 — End: 2013-02-13

## 2012-10-19 NOTE — Assessment & Plan Note (Addendum)
She & Lucianne Muss had done an exceptional job on her diabetes. Her A1c has dropped from 11% to 7.6%. She should followup with him because of the elevated glucose recordings. Postprandial glucoses have been in the low 300s.

## 2012-10-19 NOTE — Assessment & Plan Note (Signed)
BP goals discussed Repeat BP 155/88

## 2012-10-19 NOTE — Assessment & Plan Note (Signed)
Remote past history of colitis. Stool cards will be collected and CBC repeated to rule out progressive anemia

## 2012-10-19 NOTE — Patient Instructions (Addendum)
Please complete and return stool cards; these will determine whether there is any gastrointestinal bleeding risk. Please take the probiotic , Align, every day until the bowels are normal. This will replace the normal bacteria which  are necessary for formation of normal stool and processing of food. Minimal Blood Pressure Goal= AVERAGE < 140/90;  Ideal is an AVERAGE < 135/85. This AVERAGE should be calculated from @ least 5-7 BP readings taken @ different times of day on different days of week. You should not respond to isolated BP readings , but rather the AVERAGE for that week .Please bring your  blood pressure cuff to office visits to verify that it is reliable.It  can also be checked against the blood pressure device at the pharmacy. Finger or wrist cuffs are not dependable; an arm cuff is.

## 2012-10-19 NOTE — Assessment & Plan Note (Signed)
She is having intermittent diarrhea; as noted stool cards will be collected. Probiotic recommended as a trial

## 2012-10-19 NOTE — Progress Notes (Signed)
  Subjective:    Patient ID: Sierra Fisher, female    DOB: 23-Jun-1940, 72 y.o.   MRN: [id number]  HPI The patient is here for followup of diabetes, hyperlipidemia, and hypertension.  The most recent A1c was 7.6%  , which correlates to an average sugar of  172 , and long-term risk of 52 %. Although suboptimally controlled; this represents significant improvement from her prior A1c. Apparently she has not seen her endocrinologist since he transferred to [facility] endocrinology  . Fasting blood sugar  average in low 200s .  Highest two-hour postprandial glucose is as high as low 300s  . No hypoglycemia reported on 70/30 insulin prescribed by Dr Lucianne Muss Last ophthalmologic examination <12 mos ago  revealed no retinopathy. No active podiatry assessment on record. Diet is low carb . No exercise  regularly due to diffuse arthralgias.  The most recent lipids  2013 revealed  LDL 63.2 ; HDL 36.3 ; and triglycerides 319  . There is medical compliance with the statin.  Blood pressure not monitored  . There is medical compliance  with antihypertensive medications  No  medication adverse effects  .  Her most recent hematocrit has dropped slightly to 32.1. Her iron is 24 with 6% saturation. She denies epistaxis, hemoptysis, hematuria, melena, or rectal bleeding. She is having increase in intermittent frank diarrhea. Her colonoscopy is not up to date; she does have a history remotely of colitis.  She has no unexplained weight loss, dysphagia, or abdominal pain. She has no abnormal bruising or bleeding. She has no difficulty stopping bleeding with injury.       Review of Systems Constitutional: Significant weight gain of 20# despite anemia & diarrhea;  excess fatigue Eyes: No  blurred vision;  double vision ; loss of vision Cardiovascular: no chest pain ;palpitations; racing; irregular rhythm ;syncope; or claudication. Some edema  Respiratory: Exertional dyspnea; no  paroxysmal nocturnal  dyspnea. Musculoskeletal: Diffuse arthralgias .She describes PMH of "fibromyalgia" for which she saw Dr. Phylliss Bob  of  Dermatologic: No non healing  Lesions; change in color or temperature of skin Neurologic: occasional positional tingling in feet & fingers w/o burning Endocrine: Some hair loss & ridging of nails . ? excessive thirst &  excessive urination       Objective:   Physical Exam  General appearance;adequate nourishment w/o distress.  Eyes: No conjunctival inflammation or scleral icterus is present.  Oral exam: Dental hygiene is good; lips and gums are healthy appearing.There is no oropharyngeal erythema or exudate noted.   Heart:  Normal rate and regular rhythm. S1 and S2 normal without gallop,  click, rub or other extra sounds.Grade 1/6 systolic murmur     Lungs:Chest clear to auscultation; no wheezes, rhonchi,rales ,or rubs present.No increased work of breathing.   Abdomen: bowel sounds normal, soft and non-tender without masses, organomegaly or hernias noted.  No guarding or rebound   Skin:Warm & dry.  Intact without suspicious lesions or rashes ; no jaundice . Slight tenting  Lymphatic: No lymphadenopathy is noted about the head, neck, axilla areas.             Assessment & Plan:  See Current Assessment & Plan in Problem List under specific Diagnosis

## 2012-10-20 LAB — LDL CHOLESTEROL, DIRECT: Direct LDL: 96.7 mg/dL

## 2012-10-20 LAB — CBC WITH DIFFERENTIAL/PLATELET
Basophils Absolute: 0 K/uL (ref 0.0–0.1)
Basophils Relative: 0.3 % (ref 0.0–3.0)
Eosinophils Absolute: 0.2 K/uL (ref 0.0–0.7)
Eosinophils Relative: 1.4 % (ref 0.0–5.0)
HCT: 33.3 % — ABNORMAL LOW (ref 36.0–46.0)
Hemoglobin: 10.6 g/dL — ABNORMAL LOW (ref 12.0–15.0)
Lymphocytes Relative: 16.7 % (ref 12.0–46.0)
Lymphs Abs: 2.6 K/uL (ref 0.7–4.0)
MCHC: 31.8 g/dL (ref 30.0–36.0)
MCV: 69.2 fl — ABNORMAL LOW (ref 78.0–100.0)
Monocytes Absolute: 1.3 K/uL — ABNORMAL HIGH (ref 0.1–1.0)
Monocytes Relative: 8.8 % (ref 3.0–12.0)
Neutro Abs: 11.2 K/uL — ABNORMAL HIGH (ref 1.4–7.7)
Neutrophils Relative %: 72.8 % (ref 43.0–77.0)
Platelets: 308 K/uL (ref 150.0–400.0)
RBC: 4.81 Mil/uL (ref 3.87–5.11)
RDW: 17.6 % — ABNORMAL HIGH (ref 11.5–14.6)
WBC: 15.3 K/uL — ABNORMAL HIGH (ref 4.5–10.5)

## 2012-10-20 LAB — BASIC METABOLIC PANEL
CO2: 30 mEq/L (ref 19–32)
Glucose, Bld: 118 mg/dL — ABNORMAL HIGH (ref 70–99)
Potassium: 3.9 mEq/L (ref 3.5–5.1)
Sodium: 133 mEq/L — ABNORMAL LOW (ref 135–145)

## 2012-10-20 LAB — VITAMIN B12: Vitamin B-12: 87 pg/mL — ABNORMAL LOW (ref 211–911)

## 2012-10-20 LAB — LIPID PANEL
Cholesterol: 172 mg/dL (ref 0–200)
Triglycerides: 230 mg/dL — ABNORMAL HIGH (ref 0.0–149.0)

## 2012-10-20 LAB — TSH: TSH: 0.43 u[IU]/mL (ref 0.35–5.50)

## 2012-10-21 ENCOUNTER — Other Ambulatory Visit: Payer: Self-pay | Admitting: Internal Medicine

## 2012-10-21 ENCOUNTER — Other Ambulatory Visit: Payer: Self-pay | Admitting: *Deleted

## 2012-10-21 DIAGNOSIS — I10 Essential (primary) hypertension: Secondary | ICD-10-CM

## 2012-10-21 DIAGNOSIS — E876 Hypokalemia: Secondary | ICD-10-CM

## 2012-10-21 DIAGNOSIS — IMO0001 Reserved for inherently not codable concepts without codable children: Secondary | ICD-10-CM

## 2012-10-21 DIAGNOSIS — E119 Type 2 diabetes mellitus without complications: Secondary | ICD-10-CM

## 2012-10-21 DIAGNOSIS — E785 Hyperlipidemia, unspecified: Secondary | ICD-10-CM

## 2012-10-21 MED ORDER — RAMIPRIL 2.5 MG PO CAPS: ORAL_CAPSULE | ORAL | Status: DC

## 2012-10-21 MED ORDER — POTASSIUM CHLORIDE CRYS ER 20 MEQ PO TBCR: 20.0000 meq | EXTENDED_RELEASE_TABLET | Freq: Every day | ORAL | Status: DC

## 2012-10-21 MED ORDER — CARVEDILOL 25 MG PO TABS: ORAL_TABLET | ORAL | Status: DC

## 2012-10-21 MED ORDER — PRAVASTATIN SODIUM 40 MG PO TABS: ORAL_TABLET | ORAL | Status: DC

## 2012-10-21 MED ORDER — INSULIN ASPART PROT & ASPART (70-30 MIX) 100 UNIT/ML ~~LOC~~ SUSP: 40.0000 [IU] | Freq: Two times a day (BID) | SUBCUTANEOUS | Status: DC

## 2012-10-21 MED ORDER — CHLORTHALIDONE 25 MG PO TABS: ORAL_TABLET | ORAL | Status: DC

## 2012-10-21 MED ORDER — METFORMIN HCL 1000 MG PO TABS: 1000.0000 mg | ORAL_TABLET | Freq: Two times a day (BID) | ORAL | Status: DC

## 2012-10-21 MED ORDER — ALPRAZOLAM 0.25 MG PO TABS: ORAL_TABLET | ORAL | Status: DC

## 2012-10-21 NOTE — Telephone Encounter (Signed)
Refills for coreg, insulin and potassium sent to [facility]

## 2012-10-22 ENCOUNTER — Telehealth: Payer: Self-pay | Admitting: *Deleted

## 2012-10-22 DIAGNOSIS — I10 Essential (primary) hypertension: Secondary | ICD-10-CM

## 2012-10-22 DIAGNOSIS — D510 Vitamin B12 deficiency anemia due to intrinsic factor deficiency: Secondary | ICD-10-CM

## 2012-10-22 MED ORDER — CYANOCOBALAMIN 1000 MCG/ML IJ SOLN: INTRAMUSCULAR | Status: DC

## 2012-10-22 NOTE — Telephone Encounter (Addendum)
Spoke with the pt and informed her of note below and pt also speak with Dr. Alwyn Ren.  Pt understood and agreed.  Pt had lab appts scheduled, future labs sent and med refilled.//AB/CMA

## 2012-10-22 NOTE — Telephone Encounter (Signed)
Message copied by Verdie Shire on Fri Oct 22, 2012  3:31 PM ------      Message from: Pecola Lawless      Created: Thu Oct 21, 2012  4:53 PM       OK . After 4-6 months, she's to recheck the B12 level            I have added low-dose ramipril to help protect your kidneys from diabetes. You should decrease Hygroton to one half pill daily low potassium and because this medicine has been added to protect the kidneys from diabetic injury ------

## 2012-10-26 ENCOUNTER — Encounter: Payer: Self-pay | Admitting: *Deleted

## 2012-10-26 ENCOUNTER — Telehealth: Payer: Self-pay | Admitting: *Deleted

## 2012-10-26 NOTE — Telephone Encounter (Signed)
Message copied by Eustace Quail on Tue Oct 26, 2012  1:50 PM ------      Message from: Pecola Lawless      Created: Thu Oct 21, 2012  3:58 PM       SEVERE B12 deficiency is present; please schedule weekly B12 injections ( 1000 mcg)  X 4 , then monthly injections thereafter. Recheck B12 level with CBC& dif  in 4 months (281.0).      The most common cause of elevated triglycerides (TG) is the ingestion of sugar from high fructose corn syrup sources added to processed foods & drinks.  Eat a low-fat diet with lots of fruits and vegetables, up to 7-9 servings per day. Consume less than  30  Grams (preferably ZERO) of sugar per day from foods & drinks with High Fructose Corn Syrup (HFCS) sugar as #1,2,3 or # 4 on label.[employer], [employer] & [employer] do not carry products with HFCS.      Follow a  low carb nutrition program such as [place] or [employer]  to prevent Diabetes progression . White carbohydrates (potatoes, rice, bread, and pasta) have a high spike of sugar and a high load of sugar. For example a  baked potato has a cup of sugar and a  french fry  2 teaspoons of sugar. Yams, wild  rice, whole grained bread &  wheat pasta have been much lower spike and load of  sugar. Portions should be the size of a deck of cards or your palm.       Please address the very significant B12 deficiency; B12 deficiency this severe can lead to serious hematologic and neurologic issues. Hopp       ------

## 2012-10-26 NOTE — Telephone Encounter (Signed)
Letter sent. DJR  

## 2012-11-03 ENCOUNTER — Other Ambulatory Visit: Payer: Self-pay | Admitting: *Deleted

## 2012-11-03 ENCOUNTER — Ambulatory Visit (INDEPENDENT_AMBULATORY_CARE_PROVIDER_SITE_OTHER): Payer: PRIVATE HEALTH INSURANCE | Admitting: Endocrinology

## 2012-11-03 ENCOUNTER — Encounter: Payer: Self-pay | Admitting: Endocrinology

## 2012-11-03 VITALS — BP 142/68 | HR 80 | Temp 98.7°F | Resp 12 | Ht 62.0 in | Wt 207.8 lb

## 2012-11-03 DIAGNOSIS — E039 Hypothyroidism, unspecified: Secondary | ICD-10-CM

## 2012-11-03 DIAGNOSIS — I10 Essential (primary) hypertension: Secondary | ICD-10-CM

## 2012-11-03 DIAGNOSIS — IMO0001 Reserved for inherently not codable concepts without codable children: Secondary | ICD-10-CM

## 2012-11-03 DIAGNOSIS — E785 Hyperlipidemia, unspecified: Secondary | ICD-10-CM

## 2012-11-03 LAB — URINALYSIS
Leukocytes, UA: NEGATIVE
Nitrite: NEGATIVE
Specific Gravity, Urine: 1.02 (ref 1.000–1.030)
Urine Glucose: NEGATIVE
Urobilinogen, UA: 0.2 (ref 0.0–1.0)

## 2012-11-03 LAB — HEMOGLOBIN A1C: Hgb A1c MFr Bld: 7.1 % — ABNORMAL HIGH (ref 4.6–6.5)

## 2012-11-03 LAB — MICROALBUMIN / CREATININE URINE RATIO: Microalb, Ur: 1.2 mg/dL (ref 0.0–1.9)

## 2012-11-03 MED ORDER — METFORMIN HCL ER 500 MG PO TB24: ORAL_TABLET | ORAL | Status: DC

## 2012-11-03 NOTE — Progress Notes (Signed)
Patient ID: Sierra Fisher, female   DOB: [birth year], 72 y.o.   MRN: [id number]  Sierra Fisher is an 72 y.o. female.   Reason for Appointment: Diabetes follow-up   History of Present Illness   Diagnosis: Type 2 DIABETES MELITUS, date of diagnosis: ? 2001      Previous history: She has been treated with insulin for several years. Until 2013 was only taking premixed insulin with inadequate control. In 7/13 when her A1c was 11.3 % she was given Lantus insulin in addition because of relatively high fasting glucose readings and her A1c showed improved control on her last visit in 1/14. However because of her starting to get low sugars at night after an episode of gastroenteritis her Lantus was stopped  Recent history: She has not been seen in followup since January. No recent A1c reports are available  She is probably taking the generic 70/30 insulin now. She thinks she is taking her evening insulin around 8 PM but not before eating and her mealtimes are variable. She also thinks that if she does not take her insulin right before eating she will get low. However has been very irregular with checking her blood sugar In the last month or so she has been having some diarrhea and the last few days she has experienced about 2 episodes of low sugars at night. She had symptoms of sweating and shortness of breath and was treated these with food     Oral hypoglycemic drugs: Metformin        Side effects from medications: None Insulin regimen: 70/30 insulin 40 units twice a day ? [facility] brand Proper timing of medications in relation to meals:  no.          Monitors blood glucose: Once a day.    Glucometer:  FreeStyle        Blood Glucose readings from recall: readings before breakfast: 200+ recently but does not remember exact readings and has checked it regularly. No readings and evenings  Hypoglycemia frequency:  rarely at night, glucose has been about 40-50 with low sugar episodes       Meals: 3 meals per  day.          Physical activity: exercise: none           Dietician visit: Most recent: 8/13           The last HbgA1c was reported as 6.9% in 02/2012   Wt Readings from Last 3 Encounters:  11/03/12 207 lb 12.8 oz (94.257 kg)  10/19/12 208 lb 9.6 oz (94.62 kg)  12/26/11 188 lb 12.8 oz (85.639 kg)   Last microalbumin was normal in 06/2011  Lab Results  Component Value Date   MICROALBUR 1.2 11/03/2012     LABS:  No visits with results within 1 Week(s) from this visit. Latest known visit with results is:  Office Visit on 10/19/2012  Component Date Value Range Status  . WBC 10/19/2012 15.3* 4.5 - 10.5 K/uL Final  . RBC 10/19/2012 4.81  3.87 - 5.11 Mil/uL Final  . Hemoglobin 10/19/2012 10.6* 12.0 - 15.0 g/dL Final  . HCT 78/29/5621 33.3* 36.0 - 46.0 % Final  . MCV 10/19/2012 69.2* 78.0 - 100.0 fl Final  . MCHC 10/19/2012 31.8  30.0 - 36.0 g/dL Final  . RDW 30/86/5784 17.6* 11.5 - 14.6 % Final  . Platelets 10/19/2012 308.0  150.0 - 400.0 K/uL Final  . Neutrophils Relative % 10/19/2012 72.8  43.0 - 77.0 % Final  .  Lymphocytes Relative 10/19/2012 16.7  12.0 - 46.0 % Final  . Monocytes Relative 10/19/2012 8.8  3.0 - 12.0 % Final  . Eosinophils Relative 10/19/2012 1.4  0.0 - 5.0 % Final  . Basophils Relative 10/19/2012 0.3  0.0 - 3.0 % Final  . Neutro Abs 10/19/2012 11.2* 1.4 - 7.7 K/uL Final  . Lymphs Abs 10/19/2012 2.6  0.7 - 4.0 K/uL Final  . Monocytes Absolute 10/19/2012 1.3* 0.1 - 1.0 K/uL Final  . Eosinophils Absolute 10/19/2012 0.2  0.0 - 0.7 K/uL Final  . Basophils Absolute 10/19/2012 0.0  0.0 - 0.1 K/uL Final  . Microcytosis 10/19/2012 Presence of  None Final  . Vitamin B-12 10/19/2012 87* 211 - 911 pg/mL Final  . Cholesterol 10/19/2012 172  0 - 200 mg/dL Final   ATP III Classification       Desirable:  < 200 mg/dL               Borderline High:  200 - 239 mg/dL          High:  > = 161 mg/dL  . Triglycerides 10/19/2012 230.0* 0.0 - 149.0 mg/dL Final   Normal:  <096  mg/dLBorderline High:  150 - 199 mg/dL  . HDL 10/19/2012 45.30  >39.00 mg/dL Final  . VLDL 04/54/0981 46.0* 0.0 - 40.0 mg/dL Final  . Total CHOL/HDL Ratio 10/19/2012 4   Final                  Men          Women1/2 Average Risk     3.4          3.3Average Risk          5.0          4.42X Average Risk          9.6          7.13X Average Risk          15.0          11.0                      . AST 10/19/2012 24  0 - 37 U/L Final  . ALT 10/19/2012 19  0 - 35 U/L Final  . Sodium 10/19/2012 133* 135 - 145 mEq/L Final  . Potassium 10/19/2012 3.9  3.5 - 5.1 mEq/L Final  . Chloride 10/19/2012 96  96 - 112 mEq/L Final  . CO2 10/19/2012 30  19 - 32 mEq/L Final  . Glucose, Bld 10/19/2012 118* 70 - 99 mg/dL Final  . BUN 19/14/7829 10  6 - 23 mg/dL Final  . Creatinine, Ser 10/19/2012 0.8  0.4 - 1.2 mg/dL Final  . Calcium 56/21/3086 9.9  8.4 - 10.5 mg/dL Final  . GFR 57/84/6962 70.81  >60.00 mL/min Final  . TSH 10/19/2012 0.43  0.35 - 5.50 uIU/mL Final  . Direct LDL 10/19/2012 96.7   Final   Optimal:  <100 mg/dLNear or Above Optimal:  100-129 mg/dLBorderline High:  130-159 mg/dLHigh:  160-189 mg/dLVery High:  >190 mg/dL      Medication List       This list is accurate as of: 11/03/12  2:11 PM.  Always use your most recent med list.               ALPRAZolam 0.25 MG tablet  Commonly known as:  XANAX  TAKE ONE-HALF TO ONE TABLET BY MOUTH EVERY DAY AT BEDTIME AS NEEDED  aspirin 81 MG EC tablet  Take 81 mg by mouth daily.     carvedilol 25 MG tablet  Commonly known as:  COREG  TAKE ONE TABLET BY MOUTH TWICE DAILY     chlorthalidone 25 MG tablet  Commonly known as:  HYGROTON  TAKE ONE HALF TABLET BY MOUTH EVERY DAY (to prevent low potassium)     cyanocobalamin 1000 MCG/ML injection  Commonly known as:  (VITAMIN B-12)  1 injection monthly     FREESTYLE TEST STRIPS test strip  Generic drug:  glucose blood  1 each by Other route. Use as instructed- 2-3 time daily insulin dependent      glucose monitoring kit monitoring kit  1 each by Does not apply route as needed.     insulin aspart protamine- aspart (70-30) 100 UNIT/ML injection  Commonly known as:  NOVOLOG MIX 70/30  Inject 40 Units into the skin daily with breakfast. 40 units in the am, 40 units at dinner     levothyroxine 125 MCG tablet  Commonly known as:  SYNTHROID, LEVOTHROID  TAKE ONE TABLET BY MOUTH EVERY DAY,EXCEPT TAKE ONE AND ONE-HALF ON TUESDAYS,THURSDAYS AND SATURDAYS     metFORMIN 1000 MG tablet  Commonly known as:  GLUCOPHAGE  Take 1 tablet (1,000 mg total) by mouth 2 (two) times daily with a meal.     potassium chloride SA 20 MEQ tablet  Commonly known as:  KLOR-CON M20  Take 1 tablet (20 mEq total) by mouth daily.     pravastatin 40 MG tablet  Commonly known as:  PRAVACHOL  TAKE ONE TABLET BY MOUTH EVERY DAY     ramipril 2.5 MG capsule  Commonly known as:  ALTACE  (NEW) One daily for hypertension and to prevent diabetic kidney disease     traMADol 50 MG tablet  Commonly known as:  ULTRAM  Take 1 tablet (50 mg total) by mouth every 8 (eight) hours as needed for pain.     Vitamin D3 400 UNITS Caps  Take by mouth daily.     vitamin E 1000 UNIT capsule  Take 1,000 Units by mouth daily.        Allergies: No Known Allergies  Past Medical History  Diagnosis Date  . CHF (congestive heart failure)     on Actos w/o complication  . Diabetes mellitus     type 2  . Thyroid disease     hypothyroidism  . Hypertension   . Anemia     B12 deficiency    Past Surgical History  Procedure Laterality Date  . Abdominal hysterectomy      for ovarian cyst  . Cataract extraction  03/2004,05/2004  . Cholecystectomy    . Breast surgery    . Breast lumpectomy    . Colonoscopy       X 2; colitis X 1  . Cardiac catheterization      negative    Family History  Problem Relation Age of Onset  . Adopted: Yes    Social History:  reports that she quit smoking about 37 years ago. She does not  have any smokeless tobacco history on file. She reports that she does not drink alcohol. Her drug history is not on file.  Review of Systems:  Hypertension:  fairly well controlled with Coreg, chlorthalidone and ramipril  Lipids: Currently taking pravastatin, direct LDL 97    She has had diarrhea for about a month, this is getting better now. Etiology not clear She has primary hypothyroidism,  recent TSH 0.43 Recently diagnosed with vitamin B12 deficiency, not clear if this is partly from taking metformin   Examination:   BP 142/68  Pulse 80  Temp(Src) 98.7 F (37.1 C)  Resp 12  Ht 5\' 2"  (1.575 m)  Wt 207 lb 12.8 oz (94.257 kg)  BMI 38 kg/m2  SpO2 96%  Body mass index is 38 kg/(m^2).    ASSESSMENT/ PLAN::   Diabetes type 2   Blood glucose control is difficult to assess as she does not monitor her blood sugar regularly and probably mostly in the morning She does think that her sugars are higher in the morning on waking up. Difficult to get her sugars consistently controlled with generic premixed insulin but she is concerned about the cost of other insulin brands She still has not been able to take her insulin as advised before meal in the evening and this would effect postprandial control and tendency to late night hypoglycemia Previously had been given Lantus to help morning sugars but had to stop this because of hypoglycemia following gastroenteritis Again she is having tendency to overnight hypoglycemia with recent problems with diarrhea. Most likely is having some tendency to diarrhea from metformin She is a good candidate for adding Victoza but she does not want a brand- name medication She has gained weight and not able to exercise much.  Plan: Discussed needing to check blood sugars either on waking up or 2 hours after meals to help assess her readings She will bring her monitor for download on each visit Check A1c today Check microalbumin She needs education regarding  meal planning Discussed timing of premixed insulin and need for taking this 30 minutes before meals  Again consider adding Lantus insulin or using separate  NPH and regular insulins twice a day Will also change her metformin extended release  Counseling time over 50% of today's 25 minute visit  Ruthann Angulo 11/03/2012, 2:11 PM

## 2012-11-03 NOTE — Patient Instructions (Addendum)
Take insulin 30 min before meals am and pm 40 in am and 20 before supper  Metformin ER 3 daily  Please check blood sugars at least half the time about 2 hours after any meal and as directed on waking up. Please bring blood sugar monitor to each visit

## 2012-11-12 ENCOUNTER — Telehealth: Payer: Self-pay | Admitting: *Deleted

## 2012-11-12 MED ORDER — INSULIN NPH ISOPHANE & REGULAR (70-30) 100 UNIT/ML ~~LOC~~ SUSP: SUBCUTANEOUS | Status: DC

## 2012-11-12 NOTE — Telephone Encounter (Signed)
Pt is requesting an rx for Novolin 70/30, her chart says novolog, but she says she must have misinformed you, she's been on the Novolin 70/30 all along, okay to send?

## 2012-11-12 NOTE — Telephone Encounter (Signed)
ok 

## 2012-11-12 NOTE — Telephone Encounter (Signed)
rx sent

## 2012-11-19 ENCOUNTER — Other Ambulatory Visit: Payer: Self-pay | Admitting: *Deleted

## 2012-11-19 ENCOUNTER — Telehealth: Payer: Self-pay | Admitting: *Deleted

## 2012-11-19 DIAGNOSIS — IMO0001 Reserved for inherently not codable concepts without codable children: Secondary | ICD-10-CM

## 2012-11-19 DIAGNOSIS — I10 Essential (primary) hypertension: Secondary | ICD-10-CM

## 2012-11-19 MED ORDER — RAMIPRIL 5 MG PO CAPS: ORAL_CAPSULE | ORAL | Status: DC

## 2012-11-19 MED ORDER — CYANOCOBALAMIN 1000 MCG/ML IJ SOLN: INTRAMUSCULAR | Status: DC

## 2012-11-19 NOTE — Telephone Encounter (Signed)
Patient called and stated that her blood pressure has been averaging 190/88 since her last visit in September. She denies chest pain, dizziness, and blurred vision. She stated that she has been experiencing fatigue on and off. Please advise.

## 2012-11-19 NOTE — Telephone Encounter (Signed)
Increase Ramipril to 5 mg qd & make appt .Minimal Blood Pressure Goal= AVERAGE < 140/90;  Ideal is an AVERAGE < 135/85. This AVERAGE should be calculated from @ least 5-7 BP readings taken @ different times of day on different days of week. You should not respond to isolated BP readings , but rather the AVERAGE for that week .Please bring your  blood pressure cuff to office visit to verify that it is reliable

## 2012-11-19 NOTE — Telephone Encounter (Signed)
Refill for B-12 injections resubmitted to [facility] with diagnosis code so her prescription can be covered by her insurance.

## 2012-11-19 NOTE — Telephone Encounter (Signed)
Spoke with patient regarding change in Ramipril. Patient verbalized understanding and will continue to monitor blood pressure and call with an update next week. Patient was advised to go to the ER if she developed dizziness, chest pain, shortness of breath.

## 2012-11-30 ENCOUNTER — Other Ambulatory Visit: Payer: Self-pay | Admitting: *Deleted

## 2012-11-30 MED ORDER — GLUCOSE BLOOD VI STRP: ORAL_STRIP | Status: DC

## 2012-12-03 ENCOUNTER — Encounter: Payer: Self-pay | Admitting: Endocrinology

## 2012-12-03 ENCOUNTER — Ambulatory Visit (INDEPENDENT_AMBULATORY_CARE_PROVIDER_SITE_OTHER): Payer: PRIVATE HEALTH INSURANCE | Admitting: Endocrinology

## 2012-12-03 ENCOUNTER — Other Ambulatory Visit (INDEPENDENT_AMBULATORY_CARE_PROVIDER_SITE_OTHER): Payer: PRIVATE HEALTH INSURANCE

## 2012-12-03 ENCOUNTER — Other Ambulatory Visit: Payer: Self-pay | Admitting: *Deleted

## 2012-12-03 VITALS — BP 148/78 | HR 85 | Temp 98.3°F | Resp 12 | Ht 63.0 in | Wt 206.7 lb

## 2012-12-03 DIAGNOSIS — IMO0001 Reserved for inherently not codable concepts without codable children: Secondary | ICD-10-CM

## 2012-12-03 DIAGNOSIS — I10 Essential (primary) hypertension: Secondary | ICD-10-CM

## 2012-12-03 DIAGNOSIS — E039 Hypothyroidism, unspecified: Secondary | ICD-10-CM

## 2012-12-03 DIAGNOSIS — D510 Vitamin B12 deficiency anemia due to intrinsic factor deficiency: Secondary | ICD-10-CM

## 2012-12-03 LAB — CBC WITH DIFFERENTIAL/PLATELET
Basophils Relative: 1 % (ref 0.0–3.0)
Eosinophils Absolute: 0.2 10*3/uL (ref 0.0–0.7)
Eosinophils Relative: 2.3 % (ref 0.0–5.0)
HCT: 32.5 % — ABNORMAL LOW (ref 36.0–46.0)
Hemoglobin: 10.3 g/dL — ABNORMAL LOW (ref 12.0–15.0)
MCHC: 31.8 g/dL (ref 30.0–36.0)
MCV: 69.4 fl — ABNORMAL LOW (ref 78.0–100.0)
Monocytes Absolute: 0.8 10*3/uL (ref 0.1–1.0)
Neutro Abs: 6.9 10*3/uL (ref 1.4–7.7)
Neutrophils Relative %: 68.8 % (ref 43.0–77.0)
RBC: 4.68 Mil/uL (ref 3.87–5.11)
WBC: 10 10*3/uL (ref 4.5–10.5)

## 2012-12-03 LAB — VITAMIN B12: Vitamin B-12: 230 pg/mL (ref 211–911)

## 2012-12-03 NOTE — Patient Instructions (Signed)
Take pm dose 20-30 min before supper; go up to 24 at supper  Please check blood sugars at least half the time about 2 hours after any meal and as directed on waking up. Please bring blood sugar monitor to each visit

## 2012-12-03 NOTE — Progress Notes (Signed)
Patient ID: Sierra Fisher, female   DOB: August 04, 1940, 72 y.o.   MRN: [id number]  Sierra Fisher is an 72 y.o. female.   Reason for Appointment: Diabetes follow-up   History of Present Illness   Diagnosis: Type 2 DIABETES MELITUS, date of diagnosis: ? 2001      Previous history: She has been treated with insulin for several years. Until 2013 was only taking premixed insulin with inadequate control. In 7/13 when her A1c was 11.3 % she was given Lantus insulin in addition because of relatively high fasting glucose readings and her A1c showed improved control on her last visit in 1/14. However because of her starting to get low sugars at night after an episode of gastroenteritis her Lantus was stopped  Recent history:  She is taking the generic 70/30 insulin twice a day now.   On her last visit she was taking her evening insulin a couple of hours after her evening meal She thinks she is taking her evening insulin better as directed before eating although her mealtimes are variable. She apparently felt that if she does not take her insulin right before eating she will get low. She has checked her blood sugar a little bit more consistently but usually does not have readings after supper Her blood sugars are relatively better although still somewhat high in the morning overall average 165 for the last month     Oral hypoglycemic drugs: Metformin        Side effects from medications: None, not having diarrhea now Insulin regimen: 70/30 insulin, a.m. 40; p.m. 20 units, ? [facility] brand Proper timing of medications in relation to meals:  yes, usually taking before meal         Monitors blood glucose: Once a day.    Glucometer:  FreeStyle        Blood Glucose readings from recall: readings before breakfast: 131-230, readings between 8 AM-11 AM, recent average about 170, around 5 PM 134-202  8-9 PM 205, 127, not checked recently  Hypoglycemia frequency:  minimal now    Meals: 3 meals per day.           Physical activity: exercise: none             Dietician visit: Most recent: 8/13           The last HbgA1c was reported as 6.9% in 02/2012   Wt Readings from Last 3 Encounters:  12/03/12 206 lb 11.2 oz (93.759 kg)  11/03/12 207 lb 12.8 oz (94.257 kg)  10/19/12 208 lb 9.6 oz (94.62 kg)     LABS:  Lab Results  Component Value Date   HGBA1C 7.1* 11/03/2012   HGBA1C 7.6* 10/15/2012   HGBA1C 11.0* 06/05/2011   Lab Results  Component Value Date   MICROALBUR 1.2 11/03/2012   LDLCALC 62 02/22/2009   CREATININE 0.8 10/19/2012     No visits with results within 1 Week(s) from this visit. Latest known visit with results is:  Office Visit on 11/03/2012  Component Date Value Range Status  . Color, Urine 11/03/2012 LT. GREEN  Yellow;Lt. Yellow Final  . APPearance 11/03/2012 CLEAR  Clear Final  . Specific Gravity, Urine 11/03/2012 1.020  1.000-1.030 Final  . pH 11/03/2012 5.5  5.0 - 8.0 Final  . Total Protein, Urine 11/03/2012 NEGATIVE  Negative Final  . Urine Glucose 11/03/2012 NEGATIVE  Negative Final  . Ketones, ur 11/03/2012 NEGATIVE  Negative Final  . Bilirubin Urine 11/03/2012 NEGATIVE  Negative Final  .  Hgb urine dipstick 11/03/2012 NEGATIVE  Negative Final  . Urobilinogen, UA 11/03/2012 0.2  0.0 - 1.0 Final  . Leukocytes, UA 11/03/2012 NEGATIVE  Negative Final  . Nitrite 11/03/2012 NEGATIVE  Negative Final  . Hemoglobin A1C 11/03/2012 7.1* 4.6 - 6.5 % Final   Glycemic Control Guidelines for People with Diabetes:Non Diabetic:  <6%Goal of Therapy: <7%Additional Action Suggested:  >8%   . Microalb, Ur 11/03/2012 1.2  0.0 - 1.9 mg/dL Final  . Creatinine,U 16/11/9602 47.8   Final  . Microalb Creat Ratio 11/03/2012 2.5  0.0 - 30.0 mg/g Final  . Glucose, Bld 11/03/2012 116* 70 - 99 mg/dL Final      Medication List       This list is accurate as of: 12/03/12 10:52 AM.  Always use your most recent med list.               ALPRAZolam 0.25 MG tablet  Commonly known as:  XANAX   TAKE ONE-HALF TO ONE TABLET BY MOUTH EVERY DAY AT BEDTIME AS NEEDED     aspirin 81 MG EC tablet  Take 81 mg by mouth daily.     carvedilol 25 MG tablet  Commonly known as:  COREG  TAKE ONE TABLET BY MOUTH TWICE DAILY     chlorthalidone 25 MG tablet  Commonly known as:  HYGROTON  TAKE ONE HALF TABLET BY MOUTH EVERY DAY (to prevent low potassium)     cyanocobalamin 1000 MCG/ML injection  Commonly known as:  (VITAMIN B-12)  1 injection monthly     glucose blood test strip  Commonly known as:  FREESTYLE TEST STRIPS  Use to check blood sugars three times per day dx code 250.02     glucose monitoring kit monitoring kit  1 each by Does not apply route as needed.     insulin NPH-regular (70-30) 100 UNIT/ML injection  Commonly known as:  NOVOLIN 70/30  Inject 40 units in am and 20 units in pm     levothyroxine 125 MCG tablet  Commonly known as:  SYNTHROID, LEVOTHROID  TAKE ONE TABLET BY MOUTH EVERY DAY,EXCEPT TAKE ONE AND ONE-HALF ON TUESDAYS,THURSDAYS AND SATURDAYS     metFORMIN 500 MG 24 hr tablet  Commonly known as:  GLUCOPHAGE-XR  Take 2 tablets daily     potassium chloride SA 20 MEQ tablet  Commonly known as:  KLOR-CON M20  Take 1 tablet (20 mEq total) by mouth daily.     pravastatin 40 MG tablet  Commonly known as:  PRAVACHOL  TAKE ONE TABLET BY MOUTH EVERY DAY     ramipril 5 MG capsule  Commonly known as:  ALTACE  (NEW) One daily for hypertension and to prevent diabetic kidney disease     traMADol 50 MG tablet  Commonly known as:  ULTRAM  Take 1 tablet (50 mg total) by mouth every 8 (eight) hours as needed for pain.     Vitamin D3 400 UNITS Caps  Take by mouth daily.     vitamin E 1000 UNIT capsule  Take 1,000 Units by mouth daily.        Allergies: No Known Allergies  Past Medical History  Diagnosis Date  . CHF (congestive heart failure)     on Actos w/o complication  . Diabetes mellitus     type 2  . Thyroid disease     hypothyroidism  .  Hypertension   . Anemia     B12 deficiency    Past Surgical History  Procedure Laterality Date  . Abdominal hysterectomy      for ovarian cyst  . Cataract extraction  03/2004,05/2004  . Cholecystectomy    . Breast surgery    . Breast lumpectomy    . Colonoscopy       X 2; colitis X 1  . Cardiac catheterization      negative    Family History  Problem Relation Age of Onset  . Adopted: Yes    Social History:  reports that she quit smoking about 37 years ago. She does not have any smokeless tobacco history on file. She reports that she does not drink alcohol. Her drug history is not on file.  Review of Systems:  Hypertension:  fairly well controlled with Coreg, chlorthalidone and ramipril  Lipids: Currently taking pravastatin, direct LDL 97    She has primary hypothyroidism, last TSH 0.43  She had documented vitamin B12 deficiency, not clear if this is partly from taking metformin   Examination:   BP 148/78  Pulse 85  Temp(Src) 98.3 F (36.8 C)  Resp 12  Ht 5\' 3"  (1.6 m)  Wt 206 lb 11.2 oz (93.759 kg)  BMI 36.62 kg/m2  SpO2 97%  Body mass index is 36.62 kg/(m^2).   No ankle edema  ASSESSMENT/ PLAN::   Diabetes type 2   Blood glucose control is overall improved with less fluctuation and less hypoglycemia She is still preferring to take premixed insulin rather than basal bolus regimen Her A1c last month was relatively good at 7.1 Currently does not check her blood sugar after supper and this was emphasized Since fasting readings are relatively high will increase her evening dose to 24 while being aware of potential for hypoglycemia overnight She will have bedtime snack if eating early in the evening Again discussed actions off the premixed insulin, timing, blood sugar targets, balanced meals and snacks She has less diarrhea with extended release metformin and will continue this Will need to periodically monitor renal function  She is a good candidate for  adding Victoza to facilitate weight loss but she does not want a brand- name medication She is not able to exercise much.  Counseling time over 50% of today's 25 minute visit  Alessander Sikorski 12/03/2012, 10:52 AM

## 2012-12-04 ENCOUNTER — Encounter: Payer: Self-pay | Admitting: Internal Medicine

## 2012-12-04 ENCOUNTER — Other Ambulatory Visit: Payer: Self-pay | Admitting: Internal Medicine

## 2012-12-04 DIAGNOSIS — E538 Deficiency of other specified B group vitamins: Secondary | ICD-10-CM

## 2012-12-04 DIAGNOSIS — E876 Hypokalemia: Secondary | ICD-10-CM | POA: Insufficient documentation

## 2012-12-04 DIAGNOSIS — D509 Iron deficiency anemia, unspecified: Secondary | ICD-10-CM

## 2012-12-06 ENCOUNTER — Encounter: Payer: Self-pay | Admitting: *Deleted

## 2012-12-06 NOTE — Progress Notes (Signed)
Letter mailed to patient.

## 2012-12-08 ENCOUNTER — Telehealth: Payer: Self-pay | Admitting: Internal Medicine

## 2012-12-08 NOTE — Telephone Encounter (Signed)
Patient is calling for her lab results from 12/03/12. Please advise.

## 2012-12-09 NOTE — Telephone Encounter (Signed)
Called and spoke with patient concerning her lab results from 12/03/2012. She verbalized understanding.

## 2012-12-22 ENCOUNTER — Ambulatory Visit: Payer: PRIVATE HEALTH INSURANCE | Admitting: Internal Medicine

## 2012-12-23 ENCOUNTER — Encounter: Payer: Self-pay | Admitting: Internal Medicine

## 2012-12-23 ENCOUNTER — Ambulatory Visit (INDEPENDENT_AMBULATORY_CARE_PROVIDER_SITE_OTHER): Payer: PRIVATE HEALTH INSURANCE | Admitting: Internal Medicine

## 2012-12-23 VITALS — BP 147/74 | HR 88 | Temp 98.6°F | Resp 14 | Ht 62.75 in | Wt 206.8 lb

## 2012-12-23 DIAGNOSIS — I10 Essential (primary) hypertension: Secondary | ICD-10-CM

## 2012-12-23 MED ORDER — HYDRALAZINE HCL 25 MG PO TABS: 25.0000 mg | ORAL_TABLET | Freq: Three times a day (TID) | ORAL | Status: DC

## 2012-12-23 MED ORDER — GLUCOSE BLOOD VI STRP: ORAL_STRIP | Status: DC

## 2012-12-23 NOTE — Assessment & Plan Note (Signed)
Discontinue chlorthalidone because of potassium of 3.3  Add hydralazine 25 mg 3 times a day for improved blood pressure control

## 2012-12-23 NOTE — Progress Notes (Signed)
  Subjective:    Patient ID: Sierra Fisher, female    DOB: [birth year], 72 y.o.   MRN: [id number]  HPI  Blood pressures have been significantly elevated with ranges of 181-201/78/94. She has been taking carvedilol 25 mg twice a day, chlorthalidone 25 mg one half daily and ramipril 5 mg daily. Her potassium was 3.3 on 10/31.  She describes intermittent right frontal headaches.  She's also had some dyspnea with walking and lightheadedness. Also experienced is some ankle edema.  She is not having chest pain, palpitations, claudication, or paroxysmal nocturnal dyspnea.      Review of Systems  Dr. Lucianne Muss prescribes her insulin. She does have excessive thirst and  nocturia. She also has numbness and tingling in her legs. Her vision has been blurred.       Objective:   Physical Exam Appears  well-nourished & in no acute distress  No carotid bruits are present.No neck pain distention present at 10 - 15 degrees. Thyroid normal to palpation  Heart rhythm and rate are normal with Grade 1 systolic murmur & S 4 gallop.  Chest is clear with no increased work of breathing  There is no evidence of aortic aneurysm or renal artery bruits  Abdomen soft with no organomegaly or masses. No HJR  No clubbing, cyanosis or edema present.  Pedal pulses are intact   No ischemic skin changes are present .    Alert and oriented. Strength, tone, DTRs reflexes 0+ @ knees          Assessment & Plan:  See Current Assessment & Plan in Problem List under specific Diagnosis

## 2012-12-23 NOTE — Progress Notes (Signed)
Pre visit review using our clinic review tool, if applicable. No additional management support is needed unless otherwise documented below in the visit note. 

## 2012-12-23 NOTE — Patient Instructions (Signed)
Minimal Blood Pressure Goal= AVERAGE < 140/90;  Ideal is an AVERAGE < 135/85. This AVERAGE should be calculated from @ least 5-7 BP readings taken @ different times of day on different days of week. You should not respond to isolated BP readings , but rather the AVERAGE for that week .Please bring your  blood pressure cuff to office visits to verify that it is reliable.It  can also be checked against the blood pressure device at the pharmacy. 

## 2012-12-24 ENCOUNTER — Telehealth: Payer: Self-pay | Admitting: Internal Medicine

## 2012-12-24 NOTE — Telephone Encounter (Signed)
Patient called and wanted to know was she suppose to keep taking her potassium pills or not. Also patient wanted to know was she suppose to continue taking her ramipril with the new medication (hdralazine) that dr hopper prescribe for blood pressure.

## 2012-12-24 NOTE — Telephone Encounter (Signed)
Spoke with pt advised that she was to continue the potassium, ramipril and also add hydralazine 25mg  tid as directed by Dr. Alwyn Ren

## 2013-01-13 ENCOUNTER — Encounter: Payer: Self-pay | Admitting: Internal Medicine

## 2013-01-20 ENCOUNTER — Other Ambulatory Visit: Payer: Self-pay | Admitting: *Deleted

## 2013-01-20 MED ORDER — GLUCOSE BLOOD VI STRP: ORAL_STRIP | Status: DC

## 2013-02-13 ENCOUNTER — Other Ambulatory Visit: Payer: Self-pay | Admitting: Internal Medicine

## 2013-02-14 ENCOUNTER — Other Ambulatory Visit: Payer: Self-pay | Admitting: Internal Medicine

## 2013-02-14 NOTE — Telephone Encounter (Signed)
Tramadol script faxed to [facility]. JG//CMA

## 2013-02-14 NOTE — Telephone Encounter (Signed)
Tramadol 50 mg Last OV: 12/23/2012 Last refill: 10/19/12, #30, 0 refills UDS up-to-date, low risk

## 2013-02-14 NOTE — Telephone Encounter (Signed)
Levothyroxine refilled per protocol. JG//CMA 

## 2013-02-14 NOTE — Telephone Encounter (Signed)
OK x 1 

## 2013-03-07 ENCOUNTER — Other Ambulatory Visit: Payer: PRIVATE HEALTH INSURANCE

## 2013-03-09 ENCOUNTER — Ambulatory Visit: Payer: PRIVATE HEALTH INSURANCE | Admitting: Endocrinology

## 2013-03-16 ENCOUNTER — Other Ambulatory Visit (INDEPENDENT_AMBULATORY_CARE_PROVIDER_SITE_OTHER): Payer: Medicare Other

## 2013-03-16 ENCOUNTER — Other Ambulatory Visit: Payer: Self-pay | Admitting: *Deleted

## 2013-03-16 DIAGNOSIS — E538 Deficiency of other specified B group vitamins: Secondary | ICD-10-CM

## 2013-03-16 DIAGNOSIS — E876 Hypokalemia: Secondary | ICD-10-CM

## 2013-03-16 DIAGNOSIS — I10 Essential (primary) hypertension: Secondary | ICD-10-CM

## 2013-03-16 DIAGNOSIS — D509 Iron deficiency anemia, unspecified: Secondary | ICD-10-CM

## 2013-03-16 LAB — CBC WITH DIFFERENTIAL/PLATELET
BASOS ABS: 0.1 10*3/uL (ref 0.0–0.1)
Basophils Relative: 0.6 % (ref 0.0–3.0)
EOS ABS: 0.2 10*3/uL (ref 0.0–0.7)
Eosinophils Relative: 1.8 % (ref 0.0–5.0)
HCT: 32.6 % — ABNORMAL LOW (ref 36.0–46.0)
Hemoglobin: 10.3 g/dL — ABNORMAL LOW (ref 12.0–15.0)
Lymphocytes Relative: 18.2 % (ref 12.0–46.0)
Lymphs Abs: 2.5 10*3/uL (ref 0.7–4.0)
MCHC: 31.5 g/dL (ref 30.0–36.0)
MCV: 71.3 fl — ABNORMAL LOW (ref 78.0–100.0)
MONO ABS: 1.2 10*3/uL — AB (ref 0.1–1.0)
Monocytes Relative: 8.9 % (ref 3.0–12.0)
NEUTROS PCT: 70.5 % (ref 43.0–77.0)
Neutro Abs: 9.8 10*3/uL — ABNORMAL HIGH (ref 1.4–7.7)
PLATELETS: 303 10*3/uL (ref 150.0–400.0)
RBC: 4.57 Mil/uL (ref 3.87–5.11)
RDW: 17 % — ABNORMAL HIGH (ref 11.5–14.6)
WBC: 13.9 10*3/uL — AB (ref 4.5–10.5)

## 2013-03-16 LAB — IBC PANEL
IRON: 13 ug/dL — AB (ref 42–145)
Saturation Ratios: 2.7 % — ABNORMAL LOW (ref 20.0–50.0)
Transferrin: 340.7 mg/dL (ref 212.0–360.0)

## 2013-03-16 LAB — VITAMIN B12: Vitamin B-12: 597 pg/mL (ref 211–911)

## 2013-03-16 LAB — POTASSIUM: POTASSIUM: 4.2 meq/L (ref 3.5–5.1)

## 2013-03-16 MED ORDER — HYDRALAZINE HCL 25 MG PO TABS
25.0000 mg | ORAL_TABLET | Freq: Three times a day (TID) | ORAL | Status: DC
Start: 2013-03-16 — End: 2013-07-14

## 2013-03-16 NOTE — Telephone Encounter (Signed)
Rx sent to the pharmacy by e-script.//AB/CMA 

## 2013-03-20 ENCOUNTER — Other Ambulatory Visit: Payer: Self-pay | Admitting: Endocrinology

## 2013-03-21 ENCOUNTER — Ambulatory Visit: Payer: PRIVATE HEALTH INSURANCE | Admitting: Endocrinology

## 2013-03-24 ENCOUNTER — Other Ambulatory Visit: Payer: Medicare Other

## 2013-03-28 ENCOUNTER — Encounter: Payer: Self-pay | Admitting: *Deleted

## 2013-03-31 ENCOUNTER — Other Ambulatory Visit: Payer: PRIVATE HEALTH INSURANCE

## 2013-04-04 ENCOUNTER — Ambulatory Visit: Payer: PRIVATE HEALTH INSURANCE | Admitting: Endocrinology

## 2013-04-08 ENCOUNTER — Other Ambulatory Visit: Payer: Medicare Other

## 2013-04-12 ENCOUNTER — Other Ambulatory Visit (INDEPENDENT_AMBULATORY_CARE_PROVIDER_SITE_OTHER): Payer: Medicare Other

## 2013-04-12 DIAGNOSIS — E039 Hypothyroidism, unspecified: Secondary | ICD-10-CM

## 2013-04-12 DIAGNOSIS — E1165 Type 2 diabetes mellitus with hyperglycemia: Principal | ICD-10-CM

## 2013-04-12 DIAGNOSIS — IMO0001 Reserved for inherently not codable concepts without codable children: Secondary | ICD-10-CM

## 2013-04-12 LAB — COMPREHENSIVE METABOLIC PANEL
ALT: 20 U/L (ref 0–35)
AST: 21 U/L (ref 0–37)
Albumin: 3.9 g/dL (ref 3.5–5.2)
Alkaline Phosphatase: 60 U/L (ref 39–117)
BILIRUBIN TOTAL: 0.4 mg/dL (ref 0.3–1.2)
BUN: 15 mg/dL (ref 6–23)
CO2: 27 mEq/L (ref 19–32)
CREATININE: 0.8 mg/dL (ref 0.4–1.2)
Calcium: 9.8 mg/dL (ref 8.4–10.5)
Chloride: 101 mEq/L (ref 96–112)
GFR: 77.03 mL/min (ref >60.00)
Glucose, Bld: 197 mg/dL — ABNORMAL HIGH (ref 70–99)
Potassium: 3.8 mEq/L (ref 3.5–5.1)
Sodium: 136 mEq/L (ref 135–145)
Total Protein: 7.3 g/dL (ref 6.0–8.3)

## 2013-04-12 LAB — MICROALBUMIN / CREATININE URINE RATIO
Creatinine,U: 63.8 mg/dL
MICROALB/CREAT RATIO: 0.9 mg/g (ref 0.0–30.0)
Microalb, Ur: 0.6 mg/dL (ref 0.0–1.9)

## 2013-04-12 LAB — URINALYSIS, ROUTINE W REFLEX MICROSCOPIC
BILIRUBIN URINE: NEGATIVE
Hgb urine dipstick: NEGATIVE
Ketones, ur: NEGATIVE
LEUKOCYTES UA: NEGATIVE
NITRITE: NEGATIVE
RBC / HPF: NONE SEEN (ref 0–?)
SPECIFIC GRAVITY, URINE: 1.01 (ref 1.000–1.030)
Total Protein, Urine: NEGATIVE
UROBILINOGEN UA: 0.2 (ref 0.0–1.0)
Urine Glucose: NEGATIVE
WBC, UA: NONE SEEN (ref 0–?)
pH: 7 (ref 5.0–8.0)

## 2013-04-12 LAB — T4, FREE: Free T4: 1.31 ng/dL (ref 0.60–1.60)

## 2013-04-12 LAB — HEMOGLOBIN A1C: HEMOGLOBIN A1C: 7.9 % — AB (ref 4.6–6.5)

## 2013-04-18 ENCOUNTER — Encounter: Payer: Self-pay | Admitting: Endocrinology

## 2013-04-18 ENCOUNTER — Ambulatory Visit (INDEPENDENT_AMBULATORY_CARE_PROVIDER_SITE_OTHER): Payer: Medicare Other | Admitting: Endocrinology

## 2013-04-18 VITALS — BP 130/68 | HR 90 | Temp 97.8°F | Resp 14 | Ht 63.0 in | Wt 210.6 lb

## 2013-04-18 DIAGNOSIS — IMO0001 Reserved for inherently not codable concepts without codable children: Secondary | ICD-10-CM

## 2013-04-18 DIAGNOSIS — E785 Hyperlipidemia, unspecified: Secondary | ICD-10-CM

## 2013-04-18 DIAGNOSIS — E1165 Type 2 diabetes mellitus with hyperglycemia: Principal | ICD-10-CM

## 2013-04-18 DIAGNOSIS — I10 Essential (primary) hypertension: Secondary | ICD-10-CM

## 2013-04-18 MED ORDER — GLUCOSE BLOOD VI STRP: ORAL_STRIP | Status: DC

## 2013-04-18 NOTE — Patient Instructions (Addendum)
If sugar on waking > 130 and 2 hrs after dinner is > 170 then increase dosage to 28 units at dinner  Please check blood sugars at least half the time about 2 hours after either breakfast or supper and as directed on waking up. Please bring blood sugar monitor to each visit  Reduce portions, carbohydrate amounts and fat intake at meals and snacks Start regular walking

## 2013-04-18 NOTE — Progress Notes (Signed)
Patient ID: Sierra Fisher, female   DOB: 06-Dec-1940, 73 y.o.   MRN: [id number]   Reason for Appointment: Diabetes follow-up   History of Present Illness   Diagnosis: Type 2 DIABETES MELITUS, date of diagnosis: ? 2001      Previous history: She has been treated with insulin for several years. Until 2013 was only taking premixed insulin with inadequate control. In 7/13 when her A1c was 11.3 % she was given Lantus insulin in addition because of relatively high fasting glucose readings and her A1c showed improved control on her last visit in 1/14. However because of her starting to get low sugars at night after an episode of gastroenteritis her Lantus was stopped  Recent history: She has not checked her blood sugar because of difficulty getting her Freestyle strips at her pharmacy  She is taking the generic 70/30 insulin twice a day Her does was increased in evening last time because of relatively higher readings fasting and after supper However her readings in December were still high She thinks she tends to eat more in the wintertime because of being indoors too much and has gained some weight She has been taking only to metformin today instead of 3 by mistake A1c is higher also this time     Oral hypoglycemic drugs: Metformin 2 daily       Side effects from medications: None, not having diarrhea from metformin Insulin regimen: 70/30 insulin, a.m. 40 units; supper time: 25 units   Proper timing of medications in relation to meals:  yes, usually taking before meal and tries to wait a few minutes after injection    Monitors blood glucose: Once a day.    Glucometer:  FreeStyle   Insulinx  Blood Glucose readings none, not checked recently  The readings from December: Fasting  145-201, after meal 282 Hypoglycemia frequency:  none now     Meals: 2 meals per day. Not watching diet         Physical activity: exercise: none             Dietician visit: Most recent: 8/13           The HbgA1c was  reported as 6.9% in 02/2012  Wt Readings from Last 3 Encounters:  04/18/13 210 lb 9.6 oz (95.528 kg)  12/23/12 206 lb 12.8 oz (93.804 kg)  12/03/12 206 lb 11.2 oz (93.759 kg)    LABS:  Lab Results  Component Value Date   HGBA1C 7.9* 04/12/2013   HGBA1C 7.1* 11/03/2012   HGBA1C 7.6* 10/15/2012   Lab Results  Component Value Date   MICROALBUR 0.6 04/12/2013   LDLCALC 62 02/22/2009   CREATININE 0.8 04/12/2013     Appointment on 04/12/2013  Component Date Value Ref Range Status  . Hemoglobin A1C 04/12/2013 7.9* 4.6 - 6.5 % Final   Glycemic Control Guidelines for People with Diabetes:Non Diabetic:  <6%Goal of Therapy: <7%Additional Action Suggested:  >8%   . Sodium 04/12/2013 136  135 - 145 mEq/L Final  . Potassium 04/12/2013 3.8  3.5 - 5.1 mEq/L Final  . Chloride 04/12/2013 101  96 - 112 mEq/L Final  . CO2 04/12/2013 27  19 - 32 mEq/L Final  . Glucose, Bld 04/12/2013 197* 70 - 99 mg/dL Final  . BUN 04/12/2013 15  6 - 23 mg/dL Final  . Creatinine, Ser 04/12/2013 0.8  0.4 - 1.2 mg/dL Final  . Total Bilirubin 04/12/2013 0.4  0.3 - 1.2 mg/dL Final  . Alkaline Phosphatase  04/12/2013 60  39 - 117 U/L Final  . AST 04/12/2013 21  0 - 37 U/L Final  . ALT 04/12/2013 20  0 - 35 U/L Final  . Total Protein 04/12/2013 7.3  6.0 - 8.3 g/dL Final  . Albumin 04/12/2013 3.9  3.5 - 5.2 g/dL Final  . Calcium 04/12/2013 9.8  8.4 - 10.5 mg/dL Final  . GFR 04/12/2013 77.03  >60.00 mL/min Final  . Microalb, Ur 04/12/2013 0.6  0.0 - 1.9 mg/dL Final  . Creatinine,U 04/12/2013 63.8   Final  . Microalb Creat Ratio 04/12/2013 0.9  0.0 - 30.0 mg/g Final  . Color, Urine 04/12/2013 YELLOW  Yellow;Lt. Yellow Final  . APPearance 04/12/2013 CLEAR  Clear Final  . Specific Gravity, Urine 04/12/2013 1.010  1.000-1.030 Final  . pH 04/12/2013 7.0  5.0 - 8.0 Final  . Total Protein, Urine 04/12/2013 NEGATIVE  Negative Final  . Urine Glucose 04/12/2013 NEGATIVE  Negative Final  . Ketones, ur 04/12/2013 NEGATIVE   Negative Final  . Bilirubin Urine 04/12/2013 NEGATIVE  Negative Final  . Hgb urine dipstick 04/12/2013 NEGATIVE  Negative Final  . Urobilinogen, UA 04/12/2013 0.2  0.0 - 1.0 Final  . Leukocytes, UA 04/12/2013 NEGATIVE  Negative Final  . Nitrite 04/12/2013 NEGATIVE  Negative Final  . WBC, UA 04/12/2013 none seen  0-2/hpf Final  . RBC / HPF 04/12/2013 none seen  0-2/hpf Final  . Squamous Epithelial / LPF 04/12/2013 Rare(0-4/hpf)  Rare(0-4/hpf) Final  . Free T4 04/12/2013 1.31  0.60 - 1.60 ng/dL Final      Medication List       This list is accurate as of: 04/18/13  1:37 PM.  Always use your most recent med list.               ALPRAZolam 0.25 MG tablet  Commonly known as:  XANAX  TAKE ONE-HALF TO ONE TABLET BY MOUTH EVERY DAY AT BEDTIME AS NEEDED     aspirin 81 MG EC tablet  Take 81 mg by mouth daily.     carvedilol 25 MG tablet  Commonly known as:  COREG  TAKE ONE TABLET BY MOUTH TWICE DAILY     chlorthalidone 25 MG tablet  Commonly known as:  HYGROTON     cyanocobalamin 1000 MCG/ML injection  Commonly known as:  (VITAMIN B-12)  1 injection monthly     glucose blood test strip  Commonly known as:  FREESTYLE TEST STRIPS  Use to check blood sugars three times per day dx code 250.02     glucose monitoring kit monitoring kit  1 each by Does not apply route as needed.     hydrALAZINE 25 MG tablet  Commonly known as:  APRESOLINE  Take 1 tablet (25 mg total) by mouth 3 (three) times daily.     insulin NPH-regular Human (70-30) 100 UNIT/ML injection  Commonly known as:  NOVOLIN 70/30  Inject 40 units in am and 25 units in pm     levothyroxine 125 MCG tablet  Commonly known as:  SYNTHROID, LEVOTHROID  TAKE ONE TABLET BY MOUTH EVERY DAY,EXCEPT TAKE ONE AND ONE-HALF TABLET ON TUESDAYS,THURSDAYS AND SATURDAYS     metFORMIN 500 MG 24 hr tablet  Commonly known as:  GLUCOPHAGE-XR  Take 3 tablets daily     potassium chloride SA 20 MEQ tablet  Commonly known as:  KLOR-CON  M20  Take 1 tablet (20 mEq total) by mouth daily.     pravastatin 40 MG tablet  Commonly known  as:  PRAVACHOL  TAKE ONE TABLET BY MOUTH EVERY DAY     ramipril 5 MG capsule  Commonly known as:  ALTACE  (NEW) One daily for hypertension and to prevent diabetic kidney disease     traMADol 50 MG tablet  Commonly known as:  ULTRAM  TAKE ONE TABLET BY MOUTH EVERY 8 HOURS AS NEEDED FOR PAIN     Vitamin D3 400 UNITS Caps  Take by mouth daily.     vitamin E 1000 UNIT capsule  Take 1,000 Units by mouth daily.        Allergies: No Known Allergies  Past Medical History  Diagnosis Date  . CHF (congestive heart failure)     on Actos w/o complication  . Diabetes mellitus     type 2  . Thyroid disease     hypothyroidism  . Hypertension   . Anemia     B12 deficiency    Past Surgical History  Procedure Laterality Date  . Abdominal hysterectomy      for ovarian cyst  . Cataract extraction  03/2004,05/2004  . Cholecystectomy    . Breast surgery    . Breast lumpectomy    . Colonoscopy       X 2; colitis X 1  . Cardiac catheterization      negative    Family History  Problem Relation Age of Onset  . Adopted: Yes    Social History:  reports that she quit smoking about 38 years ago. She does not have any smokeless tobacco history on file. She reports that she does not drink alcohol. Her drug history is not on file.  Review of Systems:  Hypertension:  is well controlled with Coreg, chlorthalidone and ramipril  Lipids: Currently taking pravastatin, direct LDL 97 previously    She has primary hypothyroidism, last TSH 0.43, today's level pending  Lab Results  Component Value Date   TSH 0.43 10/19/2012   She had documented vitamin B12 deficiency, not clear if this is partly from taking metformin  Seasonal depression during wintertime   Examination:   BP 130/68  Pulse 90  Temp(Src) 97.8 F (36.6 C)  Resp 14  Ht _0  (1.6 m)  Wt 210 lb 9.6 oz (95.528 kg)  BMI 37.32  kg/m2  SpO2 94%  Body mass index is 37.32 kg/(m^2).   No ankle edema  ASSESSMENT/ PLAN::   Diabetes type 2   Blood glucose control is overall worse visit by her A1c going up She thinks most of her hyperglycemia is related to poor diet Unable to assess her readings recently seen she has not monitored at all; lab glucose after breakfast was relatively high Although she can do better with the bolus regimen instead of premixed insulin she still does not want to do this because of cost Occasionally will have rather high readings after supper based on what she is eating She is a good candidate for adding Victoza to facilitate weight loss but she does not want a brand- name medication She is not able to exercise much but is planning to start walking  Recommendations today  Restart glucose monitoring, printed prescription given for strips  Some readings after meals including after breakfast  Given guidelines for adjusting her evening dose higher if blood sugars are over 170 after meal or over 140 in the morning  Improve carbohydrate and fat intake as well as snacks  Increase metformin to 3 tablets daily  Hypertension: Better controlled now  Counseling time over  50% of today's 25 minute visit  Sierra Fisher 04/18/2013, 1:37 PM

## 2013-04-21 ENCOUNTER — Other Ambulatory Visit: Payer: Self-pay | Admitting: *Deleted

## 2013-04-21 DIAGNOSIS — E876 Hypokalemia: Secondary | ICD-10-CM

## 2013-04-21 MED ORDER — POTASSIUM CHLORIDE CRYS ER 20 MEQ PO TBCR: 20.0000 meq | EXTENDED_RELEASE_TABLET | Freq: Every day | ORAL | Status: DC

## 2013-05-30 ENCOUNTER — Ambulatory Visit: Payer: Medicare Other | Admitting: Endocrinology

## 2013-06-06 ENCOUNTER — Other Ambulatory Visit: Payer: Self-pay

## 2013-06-06 DIAGNOSIS — IMO0001 Reserved for inherently not codable concepts without codable children: Secondary | ICD-10-CM

## 2013-06-06 DIAGNOSIS — I10 Essential (primary) hypertension: Secondary | ICD-10-CM

## 2013-06-06 DIAGNOSIS — E1165 Type 2 diabetes mellitus with hyperglycemia: Principal | ICD-10-CM

## 2013-06-06 MED ORDER — RAMIPRIL 5 MG PO CAPS: ORAL_CAPSULE | ORAL | Status: DC

## 2013-06-07 ENCOUNTER — Ambulatory Visit: Payer: Medicare Other | Admitting: Endocrinology

## 2013-06-07 ENCOUNTER — Other Ambulatory Visit: Payer: Self-pay

## 2013-06-07 MED ORDER — CHLORTHALIDONE 25 MG PO TABS
ORAL_TABLET | ORAL | Status: DC
Start: 2013-06-07 — End: 2013-12-10

## 2013-07-14 ENCOUNTER — Ambulatory Visit (INDEPENDENT_AMBULATORY_CARE_PROVIDER_SITE_OTHER)
Admission: RE | Admit: 2013-07-14 | Discharge: 2013-07-14 | Disposition: A | Discharge status: Home / Self Care | Payer: Medicare Other | Source: Ambulatory Visit | Attending: Internal Medicine | Admitting: Internal Medicine

## 2013-07-14 ENCOUNTER — Encounter: Payer: Self-pay | Admitting: Internal Medicine

## 2013-07-14 ENCOUNTER — Other Ambulatory Visit (INDEPENDENT_AMBULATORY_CARE_PROVIDER_SITE_OTHER): Payer: Medicare Other

## 2013-07-14 ENCOUNTER — Ambulatory Visit (INDEPENDENT_AMBULATORY_CARE_PROVIDER_SITE_OTHER): Payer: Medicare Other | Admitting: Internal Medicine

## 2013-07-14 VITALS — BP 184/84 | HR 76 | Temp 97.9°F | Wt 209.8 lb

## 2013-07-14 DIAGNOSIS — R0609 Other forms of dyspnea: Secondary | ICD-10-CM

## 2013-07-14 DIAGNOSIS — R0989 Other specified symptoms and signs involving the circulatory and respiratory systems: Secondary | ICD-10-CM

## 2013-07-14 DIAGNOSIS — E039 Hypothyroidism, unspecified: Secondary | ICD-10-CM

## 2013-07-14 DIAGNOSIS — R0600 Dyspnea, unspecified: Secondary | ICD-10-CM

## 2013-07-14 DIAGNOSIS — D509 Iron deficiency anemia, unspecified: Secondary | ICD-10-CM

## 2013-07-14 DIAGNOSIS — R609 Edema, unspecified: Secondary | ICD-10-CM

## 2013-07-14 DIAGNOSIS — I10 Essential (primary) hypertension: Secondary | ICD-10-CM

## 2013-07-14 LAB — CBC WITH DIFFERENTIAL/PLATELET
BASOS PCT: 0.8 % (ref 0.0–3.0)
Basophils Absolute: 0.1 10*3/uL (ref 0.0–0.1)
Eosinophils Absolute: 0.3 10*3/uL (ref 0.0–0.7)
Eosinophils Relative: 3 % (ref 0.0–5.0)
HCT: 31.9 % — ABNORMAL LOW (ref 36.0–46.0)
Hemoglobin: 10.3 g/dL — ABNORMAL LOW (ref 12.0–15.0)
LYMPHS ABS: 2 10*3/uL (ref 0.7–4.0)
Lymphocytes Relative: 18.7 % (ref 12.0–46.0)
MCHC: 32.2 g/dL (ref 30.0–36.0)
MONO ABS: 1.3 10*3/uL — AB (ref 0.1–1.0)
Monocytes Relative: 12.2 % — ABNORMAL HIGH (ref 3.0–12.0)
NEUTROS PCT: 65.3 % (ref 43.0–77.0)
Neutro Abs: 7 10*3/uL (ref 1.4–7.7)
PLATELETS: 315 10*3/uL (ref 150.0–400.0)
RBC: 4.8 Mil/uL (ref 3.87–5.11)
RDW: 17.1 % — ABNORMAL HIGH (ref 11.5–15.5)
WBC: 10.8 10*3/uL — ABNORMAL HIGH (ref 4.0–10.5)

## 2013-07-14 LAB — HEPATIC FUNCTION PANEL
ALBUMIN: 3.8 g/dL (ref 3.5–5.2)
ALT: 22 U/L (ref 0–35)
AST: 24 U/L (ref 0–37)
Alkaline Phosphatase: 61 U/L (ref 39–117)
Bilirubin, Direct: 0.1 mg/dL (ref 0.0–0.3)
TOTAL PROTEIN: 7.6 g/dL (ref 6.0–8.3)
Total Bilirubin: 0.4 mg/dL (ref 0.2–1.2)

## 2013-07-14 LAB — BASIC METABOLIC PANEL
BUN: 12 mg/dL (ref 6–23)
CALCIUM: 10.1 mg/dL (ref 8.4–10.5)
CO2: 27 mEq/L (ref 19–32)
Chloride: 97 mEq/L (ref 96–112)
Creatinine, Ser: 0.8 mg/dL (ref 0.4–1.2)
GFR: 74.76 mL/min (ref >60.00)
GLUCOSE: 229 mg/dL — AB (ref 70–99)
Potassium: 4.2 mEq/L (ref 3.5–5.1)
SODIUM: 133 meq/L — AB (ref 135–145)

## 2013-07-14 LAB — TSH: TSH: 1.68 u[IU]/mL (ref 0.35–4.50)

## 2013-07-14 MED ORDER — HYDRALAZINE HCL 50 MG PO TABS: 50.0000 mg | ORAL_TABLET | Freq: Three times a day (TID) | ORAL | Status: DC

## 2013-07-14 MED ORDER — LEVOTHYROXINE SODIUM 125 MCG PO TABS: ORAL_TABLET | ORAL | Status: DC

## 2013-07-14 MED ORDER — FUROSEMIDE 40 MG PO TABS: 40.0000 mg | ORAL_TABLET | Freq: Every day | ORAL | Status: DC

## 2013-07-14 NOTE — Patient Instructions (Addendum)
Minimal Blood Pressure Goal= AVERAGE < 140/90;  Ideal is an AVERAGE < 135/85. This AVERAGE should be calculated from @ least 5-7 BP readings taken @ different times of day on different days of week. You should not respond to isolated BP readings , but rather the AVERAGE for that week .Please bring your  blood pressure cuff to office visits to verify that it is reliable.It  can also be checked against the blood pressure device at the pharmacy. Finger or wrist cuffs are not dependable; an arm cuff is. Avoid ingestion of  excess salt/sodium.Cook with pepper & other spices . Use the salt substitute  Mrs Deliah Boston products to season food @ the table. Avoid foods which taste salty or "vinegary" as their sodium content will be high.   Your next office appointment will be determined based upon review of your pending labs & x-rays. Those instructions will be transmitted to you  by mail.

## 2013-07-14 NOTE — Progress Notes (Signed)
Pre visit review using our clinic review tool, if applicable. No additional management support is needed unless otherwise documented below in the visit note. 

## 2013-07-15 ENCOUNTER — Telehealth: Payer: Self-pay | Admitting: Internal Medicine

## 2013-07-15 NOTE — Progress Notes (Signed)
   Subjective:    Patient ID: Sierra Fisher, female    DOB: [birth year], 73 y.o.   MRN: [id number]  HPI  She's here concerned about her blood pressure. She has noted increasing edema in the lower extremities prompting her to check her blood pressure. Last night was 178/148.  She's had some frontal headache which does not radiate & which responds to Tylenol. It can be as severe as level VI. It is described as sharp. She has some light sensitivity but no sound sensitivity. She also describes exertional dyspnea. She's had some tingling of the right upper extremity of the last 3 weeks  She is requesting refills of meds.    Review of Systems   Chest pain, palpitations, tachycardia,  paroxysmal nocturnal dyspnea, & claudication are absent.       Objective:   Physical Exam Gen.:Central weight excess; well-nourished in appearance. Alert, appropriate and cooperative throughout exam. Appears younger than stated age  Head: Normocephalic without obvious abnormalities Eyes: No corneal or conjunctival inflammation noted. Pupils equal round reactive to light and accommodation. Extraocular motion intact.  Ears: External  ear exam reveals no significant lesions or deformities. Canals clear .TMs normal. Hearing is grossly normal bilaterally. Nose: External nasal exam reveals no deformity or inflammation. Nasal mucosa are pink and moist. No lesions or exudates noted.   Mouth: Oral mucosa and oropharynx reveal no lesions or exudates. Teeth in good repair. Neck: No deformities, masses, or tenderness noted. Thyroid normal Lungs: Normal respiratory effort; chest expands symmetrically. Lungs are clear to auscultation without rales, wheezes, or increased work of breathing. Heart: Normal rate and rhythm. Normal S1 and S2. No gallop, click, or rub. Grade 7/3-4 systolicmurmur. Abdomen: Protuberant. Bowel sounds normal; abdomen soft and nontender. No masses, organomegaly or hernias noted.        Musculoskeletal/extremities: No deformity or scoliosis noted of  the thoracic or lumbar spine.   No cyanosis, edema, or significant extremity  deformity noted. Lipidema suggested. Slight clubbing.Range of motion normal .Tone & strength normal. Hand joints normal Fingernail  health good. Able to lie down & sit up w/o help. Negative SLR bilaterally Vascular: Carotid, radial artery, dorsalis pedis and  posterior tibial pulses are full and equal. No bruits present. Neurologic: Alert and oriented x3. Deep tendon reflexes symmetrical and normal.  Gait normal         Skin: Intact without suspicious lesions or rashes. Lymph: No cervical, axillaryl lymphadenopathy present. Psych: Mood and affect are normal. Normally interactive                                                                                        Assessment & Plan:  #1 uncontrolled BP #2 lipedema #3 DOE See orders

## 2013-07-15 NOTE — Telephone Encounter (Signed)
Relevant patient education mailed to patient.  

## 2013-07-18 ENCOUNTER — Telehealth: Payer: Self-pay

## 2013-07-18 ENCOUNTER — Other Ambulatory Visit: Payer: Self-pay | Admitting: Internal Medicine

## 2013-07-18 DIAGNOSIS — M797 Fibromyalgia: Secondary | ICD-10-CM

## 2013-07-18 NOTE — Telephone Encounter (Signed)
Pt states that she thinks her fibromyalgia has flared up again, and states that her neurologist has passed away.  She would like a referral to a new neurologist. Also she would like for you to give her a call regarding her sx

## 2013-07-18 NOTE — Telephone Encounter (Signed)
Pt is ok for you to send a referral to whomever you please.

## 2013-07-18 NOTE — Telephone Encounter (Signed)
Which Neurologist did she see ; usually Rheumatologist seen.

## 2013-07-20 NOTE — Telephone Encounter (Signed)
Pt still inquiring about referral. Please advise

## 2013-07-20 NOTE — Telephone Encounter (Signed)
Referral made 6/15 ( under Referral Tab in EMR)

## 2013-07-26 ENCOUNTER — Other Ambulatory Visit: Payer: Self-pay | Admitting: Internal Medicine

## 2013-07-26 NOTE — Telephone Encounter (Signed)
Phone call from patient stating she is still very uncomfortable. She's not sure what to do since rheumatology wont see her due to this being pain management. [facility] spoke to you about this earlier this morning. Not sure what to advise patient.

## 2013-07-26 NOTE — Telephone Encounter (Signed)
   AS rheumatologist are not following this condition; please see me to assess the need for additional studies to help direct the referral appropriately.  Please bring information concerning any prior treatment of symptoms and response to those treatments.

## 2013-07-26 NOTE — Telephone Encounter (Signed)
appt set for 07/29/13.

## 2013-07-29 ENCOUNTER — Ambulatory Visit (INDEPENDENT_AMBULATORY_CARE_PROVIDER_SITE_OTHER): Payer: Medicare Other | Admitting: Internal Medicine

## 2013-07-29 ENCOUNTER — Other Ambulatory Visit (INDEPENDENT_AMBULATORY_CARE_PROVIDER_SITE_OTHER): Payer: Medicare Other

## 2013-07-29 ENCOUNTER — Encounter: Payer: Self-pay | Admitting: Internal Medicine

## 2013-07-29 VITALS — BP 124/64 | HR 81 | Temp 98.1°F | Wt 208.8 lb

## 2013-07-29 DIAGNOSIS — M546 Pain in thoracic spine: Secondary | ICD-10-CM

## 2013-07-29 DIAGNOSIS — M25519 Pain in unspecified shoulder: Secondary | ICD-10-CM

## 2013-07-29 DIAGNOSIS — R0609 Other forms of dyspnea: Secondary | ICD-10-CM

## 2013-07-29 DIAGNOSIS — M25512 Pain in left shoulder: Principal | ICD-10-CM

## 2013-07-29 DIAGNOSIS — M25511 Pain in right shoulder: Secondary | ICD-10-CM

## 2013-07-29 DIAGNOSIS — R0989 Other specified symptoms and signs involving the circulatory and respiratory systems: Secondary | ICD-10-CM

## 2013-07-29 DIAGNOSIS — R0600 Dyspnea, unspecified: Secondary | ICD-10-CM

## 2013-07-29 DIAGNOSIS — E1149 Type 2 diabetes mellitus with other diabetic neurological complication: Secondary | ICD-10-CM

## 2013-07-29 LAB — SEDIMENTATION RATE: SED RATE: 32 mm/h — AB (ref 0–22)

## 2013-07-29 LAB — CK: CK TOTAL: 61 U/L (ref 7–177)

## 2013-07-29 MED ORDER — DULOXETINE HCL 20 MG PO CPEP: 20.0000 mg | ORAL_CAPSULE | Freq: Every day | ORAL | Status: DC

## 2013-07-29 NOTE — Patient Instructions (Signed)
Your next office appointment will be determined based upon review of your pending labs. Those instructions will be transmitted to you  by mail Followup as needed for your acute issue. Please report any significant change in your symptoms. The Cardiology referral will be scheduled and you'll be notified of the time.

## 2013-07-29 NOTE — Progress Notes (Signed)
   Subjective:    Patient ID: Sierra Fisher, female    DOB: 03-10-1940, 73 y.o.   MRN: [id number]  HPI   She's concerned that she is having exacerbations of her fibromyalgia. We have been unable to obtain a referral to Rheumatologist except in [place] which would be logistically difficult for her.  Her symptoms are mainly aching in her shoulders and posterior thorax bilaterally which is variable as to timing. This is absent for periods but can be up to level VIII. Tylenol does help.  She is on Xanax but rarely takes this as it makes her "groggy". Remotely she was on amitriptyline ; this was discontinued because of potential cardiovascular risk.  She has burning in her hands and feet. She mainly has tingling in her feet and this is intermittent.  She is on pravastatin; no recent CK on record  Her B12 level was 597 in February this year  Review of Systems  She continues to have exertional dyspnea without chest pain. Her chest x-ray has been normal. She has significant comorbidities for coronary disease.  No fever, chills, significant weight change, weakness or night sweats. No blurred vision, double vision, or loss of vision No headache, vertigo, limb weakness, tremor, gait disturbance, seizures, or memory loss.         Objective:   Physical Exam  Significant or distinguishing  findings on physical exam are documented first.  Below that are other systems examined & findings.  Her hair fine.Orson Gear are somewhat moon shaped. An S4 is present without murmur. She has a history of a murmur. Abdomen is protuberant. Deep tendon reflexes 1+ at the knees. Skin reveals pallor without abnormal rashes or lesions.  General appearance: adequatenourishment w/o distress.  Eyes: No conjunctival inflammation or scleral icterus is present.  Oral exam: Dental hygiene is good; lips and gums are healthy appearing.There is no oropharyngeal erythema or exudate noted.   Heart:  Normal rate  and regular rhythm. S1 and S2 normal without gallop,  click, rub or other extra sounds     Lungs:Chest clear to auscultation; no wheezes, rhonchi,rales ,or rubs present.No increased work of breathing.   Abdomen: bowel sounds normal, soft and non-tender without masses, organomegaly or hernias noted.  No guarding or rebound .   Musculoskeletal: Able to lie flat and sit up without help. Negative straight leg raising bilaterally. Gait normal  Skin:Warm & dry.  Intact without suspicious lesions or rashes ; no jaundice or tenting  Lymphatic: No lymphadenopathy is noted about the head, neck, axilla                Assessment & Plan:  #1 shoulder posterior thoracic pain; myalgia rheumatica is not likely. Sedimentation rate will be drawn CK will also be checked as she is on statin  #2 diabetes with peripheral neuropathy. Generic Cymbalta may be an option to treat both #1 and #2.  #3 exertional dyspnea. Cardiology evaluation will be pursued.

## 2013-07-29 NOTE — Progress Notes (Signed)
Pre visit review using our clinic review tool, if applicable. No additional management support is needed unless otherwise documented below in the visit note. 

## 2013-07-29 NOTE — Progress Notes (Signed)
   Subjective:    Patient ID: Sierra Fisher, female    DOB: Mar 24, 1940, 73 y.o.   MRN: [id number]  HPI Pt presents today to f/u from her last appointment. She is continuing to have a flare of what she assumes is her fibromyalgia. The pt reports that her symptoms have improved since her last visit 07/14/13, but are still bothering her. She continues to be SOB intermittently throughout the day. Her SOB occurs more often with activity. She denies any cardiac symptoms with the SOB. She denies any light headedness or dizziness. She denies cough. Her CXR on 07/14/13 was normal.   She complains of pain down the lateral aspects of her back and across her scapular region. Her hands ache bilaterally. She is experiencing dull pain in her hips when trying to sleep. She takes her tramadol "very rarely". She is using an OTC acetaminophen combination for the pain. The pt states "I just don't feel well."  Pt was referred to rheumatology on 07/14/13. Pt states she called the office on 07/18/13 and spoke with Stanton Kidney at [place] office. Pt was told the only doctor who would see her for fibromyalgia was in [facility].   Review of Systems  Constitutional: Positive for fatigue. Negative for fever and chills.  Respiratory: Negative for cough and wheezing.   Cardiovascular: Negative for chest pain, palpitations and leg swelling.  Neurological: Negative for dizziness.       Objective:   Physical Exam Gen.: Pt is overweight, well-nourished in appearance. Pt appears anxious. Alert and cooperative throughout exam.  Eyes: No corneal or conjunctival inflammation noted. Pupils equal round reactive to light and accommodation. Extraocular motion intact.   Lungs: Normal respiratory effort; chest expands symmetrically. Lungs are clear to auscultation  Heart: Normal rate and rhythm. Normal S1 and S2. No gallop, click, or rub. No murmur                              Musculoskeletal/extremities: No deformity or scoliosis noted of   the thoracic or lumbar spine. Full ROM of bilateral UEs and LEs with normal strength and tone.   Vascular: Carotid, radial artery, dorsalis pedis pulses are full and equal. No bruits present.  Neurologic: Alert and oriented x3. Deep tendon reflexes symmetrical and normal.   Lymph: No cervical, axillary, lymphadenopathy present.  Psych: Mood and affect are normal. Normally interactive                                                                                Assessment & Plan:  #1 polymyalgia rheumatica

## 2013-07-31 DIAGNOSIS — E669 Obesity, unspecified: Secondary | ICD-10-CM | POA: Insufficient documentation

## 2013-08-08 ENCOUNTER — Other Ambulatory Visit: Payer: Self-pay

## 2013-08-08 DIAGNOSIS — E039 Hypothyroidism, unspecified: Secondary | ICD-10-CM

## 2013-08-08 MED ORDER — LEVOTHYROXINE SODIUM 125 MCG PO TABS: ORAL_TABLET | ORAL | Status: DC

## 2013-08-17 ENCOUNTER — Other Ambulatory Visit: Payer: Self-pay | Admitting: *Deleted

## 2013-08-17 DIAGNOSIS — E785 Hyperlipidemia, unspecified: Secondary | ICD-10-CM

## 2013-08-17 MED ORDER — PRAVASTATIN SODIUM 40 MG PO TABS: ORAL_TABLET | ORAL | Status: DC

## 2013-08-17 NOTE — Telephone Encounter (Signed)
Pt stated pharmacy sent request for her pravastatin. Has not received call back from md office. Inform pt only request [facility] sent was for her levothyroxine that was on 08/08/13. Inform her we will send refills on her pravastatin to [facility]...Johny Chess

## 2013-09-07 ENCOUNTER — Ambulatory Visit (INDEPENDENT_AMBULATORY_CARE_PROVIDER_SITE_OTHER): Payer: Medicare Other | Admitting: Cardiology

## 2013-09-07 ENCOUNTER — Encounter: Payer: Self-pay | Admitting: Cardiology

## 2013-09-07 VITALS — BP 132/71 | HR 88 | Ht 63.0 in | Wt 208.0 lb

## 2013-09-07 DIAGNOSIS — R079 Chest pain, unspecified: Secondary | ICD-10-CM

## 2013-09-07 DIAGNOSIS — R0602 Shortness of breath: Secondary | ICD-10-CM

## 2013-09-07 NOTE — Patient Instructions (Signed)
Your physician recommends that you schedule a follow-up appointment in: one year with Dr. Percival Spanish  We are ordering an echo of your heart  And we are ordering bloodwork

## 2013-09-07 NOTE — Progress Notes (Signed)
HPI The patient presents for evaluation of dyspnea. She is new to me. She was seen many years ago by Dr. Pauline Aus apparently for management of cardiomyopathy. She says at one point she was even being referred for possible transplant evaluation. However, she said she did not need this and had improvement in her heart function. I don't have any of these records and I don't have an old echocardiogram. She's being referred for evaluation of this. She says she short of breath and has a sensation like she cannot take a full breath. She will be short of breath with activity such as walking. She says occasionally she feels like somebody sitting on her chest.  However, she does report that her breathing has been better as the humidity waxes and wanes. She's not describing PND or orthopnea. She's not had any palpitations, presyncope or syncope. She's not having any neck or arm discomfort. She did have evaluation by Dr. Linna Darner and no clear etiology was identified.  No Known Allergies  Current Outpatient Prescriptions  Medication Sig Dispense Refill  . ALPRAZolam (XANAX) 0.25 MG tablet TAKE ONE-HALF TO ONE TABLET BY MOUTH EVERY DAY AT BEDTIME AS NEEDED  30 tablet  3  . aspirin 81 MG EC tablet Take 81 mg by mouth daily.        . carvedilol (COREG) 25 MG tablet TAKE ONE TABLET BY MOUTH TWICE DAILY  180 tablet  3  . chlorthalidone (HYGROTON) 25 MG tablet Take one half tablet by mouth once daily  45 tablet  1  . Cholecalciferol (VITAMIN D3) 400 UNITS CAPS Take by mouth daily.      . cyanocobalamin (,VITAMIN B-12,) 1000 MCG/ML injection 1 injection monthly  10 mL  1  . DULoxetine (CYMBALTA) 20 MG capsule Take 1 capsule (20 mg total) by mouth daily.  30 capsule  5  . furosemide (LASIX) 40 MG tablet Take 20 mg by mouth daily.      Marland Kitchen glucose blood (FREESTYLE TEST STRIPS) test strip Use to check blood sugars three times per day dx code 250.02  100 each  5  . glucose monitoring kit (FREESTYLE) monitoring kit 1 each by  Does not apply route as needed.        . hydrALAZINE (APRESOLINE) 50 MG tablet Take 50 mg by mouth 2 (two) times daily.      . insulin NPH-regular (NOVOLIN 70/30) (70-30) 100 UNIT/ML injection Inject 40 units in am and 25 units in pm      . levothyroxine (SYNTHROID, LEVOTHROID) 125 MCG tablet TAKE ONE TABLET BY MOUTH EVERY DAY,EXCEPT TAKE ONE AND ONE-HALF TABLET ON TUESDAYS,THURSDAYS AND SATURDAYS  180 tablet  3  . metFORMIN (GLUCOPHAGE-XR) 500 MG 24 hr tablet Take 3 tablets daily      . potassium chloride SA (KLOR-CON M20) 20 MEQ tablet Take 1 tablet (20 mEq total) by mouth daily.  30 tablet  5  . pravastatin (PRAVACHOL) 40 MG tablet TAKE ONE TABLET BY MOUTH EVERY DAY  90 tablet  1  . ramipril (ALTACE) 5 MG capsule (NEW) One daily for hypertension and to prevent diabetic kidney disease  90 capsule  1  . traMADol (ULTRAM) 50 MG tablet TAKE ONE TABLET BY MOUTH EVERY 8 HOURS AS NEEDED FOR PAIN  30 tablet  0  . vitamin E 1000 UNIT capsule Take 1,000 Units by mouth daily.       No current facility-administered medications for this visit.    Past Medical History  Diagnosis Date  .  CHF (congestive heart failure)     on Actos w/o complication  . Diabetes mellitus     type 2  . Thyroid disease     hypothyroidism  . Hypertension   . Anemia     B12 deficiency    Past Surgical History  Procedure Laterality Date  . Abdominal hysterectomy      for ovarian cyst  . Cataract extraction  03/2004,05/2004  . Cholecystectomy    . Breast surgery    . Breast lumpectomy    . Colonoscopy       X 2; colitis X 1  . Cardiac catheterization      negative    Family History  Problem Relation Age of Onset  . Adopted: Yes    History   Social History  . Marital Status: Married    Spouse Name: N/A    Number of Children: N/A  . Years of Education: N/A   Occupational History  . Not on file.   Social History Main Topics  . Smoking status: Former Smoker    Quit date: 02/04/1975  . Smokeless  tobacco: Not on file  . Alcohol Use: No  . Drug Use: Not on file  . Sexual Activity: Not on file   Other Topics Concern  . Not on file   Social History Narrative  . No narrative on file    ROS:  Positive for headaches, dizziness, cough, reflux, difficulty swallowing, 20 pound weight gain over the winter, leg cramps, fibromyalgia.  Otherwise as stated in the HPI and negative for all other systems.  PHYSICAL EXAM BP 132/71  Pulse 88  Ht 5' 3"  (1.6 m)  Wt 208 lb (94.348 kg)  BMI 36.85 kg/m2 GENERAL:  Well appearing HEENT:  Pupils equal round and reactive, fundi not visualized, oral mucosa unremarkable NECK:  No jugular venous distention, waveform within normal limits, carotid upstroke brisk and symmetric, no bruits, no thyromegaly LYMPHATICS:  No cervical, inguinal adenopathy LUNGS:  Clear to auscultation bilaterally BACK:  No CVA tenderness CHEST:  Unremarkable HEART:  PMI not displaced or sustained,S1 and S2 within normal limits, no S3, no S4, no clicks, no rubs, no murmurs ABD:  Flat, positive bowel sounds normal in frequency in pitch, no bruits, no rebound, no guarding, no midline pulsatile mass, no hepatomegaly, no splenomegaly EXT:  2 plus pulses throughout, no edema, no cyanosis no clubbing SKIN:  No rashes no nodules NEURO:  Cranial nerves II through XII grossly intact, motor grossly intact throughout PSYCH:  Cognitively intact, oriented to person place and time  EKG:   This sinus rhythm, rate 88, left axis deviation, left anterior fascicular block, poor anterior R wave progression, cannot exclude old anteroseptal infarct, borderline interventricular conduction delay.  09/07/2013   ASSESSMENT AND PLAN  DYSPNEA:  I will start with a BNP level and an echocardiogram. I don't strongly suspect ischemia but we could consider treadmill testing if she continues to have this complaint and we don't otherwise have an etiology.  CARDIOMYOPATHY:  I don't see any record of this but she  reports having had this in the past. This will be evaluated as above.  HTN:  Her blood pressure is well-controlled on the meds as listed she will continue with these.  OBESITY:  We did briefly discuss weight loss.

## 2013-09-08 LAB — BRAIN NATRIURETIC PEPTIDE: Brain Natriuretic Peptide: 53.8 pg/mL (ref 0.0–100.0)

## 2013-09-13 ENCOUNTER — Other Ambulatory Visit: Payer: Self-pay | Admitting: Endocrinology

## 2013-09-13 ENCOUNTER — Ambulatory Visit (HOSPITAL_COMMUNITY)
Admission: RE | Admit: 2013-09-13 | Discharge: 2013-09-13 | Disposition: A | Discharge status: Home / Self Care | Payer: Medicare Other | Source: Ambulatory Visit | Attending: Cardiology | Admitting: Cardiology

## 2013-09-13 DIAGNOSIS — I509 Heart failure, unspecified: Secondary | ICD-10-CM | POA: Diagnosis not present

## 2013-09-13 DIAGNOSIS — R079 Chest pain, unspecified: Secondary | ICD-10-CM | POA: Diagnosis present

## 2013-09-13 DIAGNOSIS — R0602 Shortness of breath: Secondary | ICD-10-CM | POA: Diagnosis not present

## 2013-09-13 DIAGNOSIS — E119 Type 2 diabetes mellitus without complications: Secondary | ICD-10-CM | POA: Insufficient documentation

## 2013-09-13 DIAGNOSIS — R0609 Other forms of dyspnea: Secondary | ICD-10-CM | POA: Insufficient documentation

## 2013-09-13 DIAGNOSIS — I10 Essential (primary) hypertension: Secondary | ICD-10-CM | POA: Insufficient documentation

## 2013-09-13 DIAGNOSIS — R0989 Other specified symptoms and signs involving the circulatory and respiratory systems: Secondary | ICD-10-CM | POA: Insufficient documentation

## 2013-09-13 DIAGNOSIS — I517 Cardiomegaly: Secondary | ICD-10-CM

## 2013-09-13 NOTE — Progress Notes (Signed)
2D Echo Performed 09/13/2013    Marygrace Drought, RCS

## 2013-09-19 ENCOUNTER — Other Ambulatory Visit: Payer: Self-pay | Admitting: Endocrinology

## 2013-09-19 ENCOUNTER — Telehealth: Payer: Self-pay | Admitting: Cardiology

## 2013-09-19 NOTE — Telephone Encounter (Signed)
Returned call to patient she stated she was calling to get recent echo results.Echo results not available.Message sent to Dr.Hochrein and Dr.Hochrein's nurse [place].

## 2013-09-19 NOTE — Telephone Encounter (Signed)
Wants to know if her echo results are back from last Tuesday?

## 2013-09-21 ENCOUNTER — Other Ambulatory Visit: Payer: Self-pay | Admitting: Endocrinology

## 2013-09-21 NOTE — Telephone Encounter (Signed)
Pt called again,still have not received her echo results.

## 2013-09-21 NOTE — Telephone Encounter (Signed)
Patient informed message sent to Dr. Percival Spanish and his nurse.[place]

## 2013-09-23 ENCOUNTER — Other Ambulatory Visit: Payer: Self-pay | Admitting: *Deleted

## 2013-09-23 ENCOUNTER — Telehealth: Payer: Self-pay | Admitting: Endocrinology

## 2013-09-23 MED ORDER — METFORMIN HCL ER 500 MG PO TB24: 500.0000 mg | ORAL_TABLET | Freq: Every day | ORAL | Status: DC

## 2013-09-23 NOTE — Telephone Encounter (Signed)
Patient would like a refill of her metformin.

## 2013-09-23 NOTE — Telephone Encounter (Signed)
rx sent for a 30 day supply until seen in office

## 2013-09-23 NOTE — Telephone Encounter (Signed)
Please let

## 2013-09-23 NOTE — Telephone Encounter (Signed)
Pt. Called and informed about her ECHO and her BNP

## 2013-10-03 ENCOUNTER — Ambulatory Visit (INDEPENDENT_AMBULATORY_CARE_PROVIDER_SITE_OTHER): Payer: Medicare Other | Admitting: Endocrinology

## 2013-10-03 ENCOUNTER — Other Ambulatory Visit: Payer: Self-pay | Admitting: *Deleted

## 2013-10-03 ENCOUNTER — Telehealth: Payer: Self-pay | Admitting: Endocrinology

## 2013-10-03 ENCOUNTER — Encounter: Payer: Self-pay | Admitting: Endocrinology

## 2013-10-03 VITALS — BP 126/62 | HR 87 | Temp 98.0°F | Resp 14 | Ht 63.0 in | Wt 210.0 lb

## 2013-10-03 DIAGNOSIS — IMO0001 Reserved for inherently not codable concepts without codable children: Secondary | ICD-10-CM

## 2013-10-03 DIAGNOSIS — E1165 Type 2 diabetes mellitus with hyperglycemia: Principal | ICD-10-CM

## 2013-10-03 DIAGNOSIS — I10 Essential (primary) hypertension: Secondary | ICD-10-CM

## 2013-10-03 DIAGNOSIS — E039 Hypothyroidism, unspecified: Secondary | ICD-10-CM

## 2013-10-03 LAB — HEMOGLOBIN A1C: Hgb A1c MFr Bld: 8.2 % — ABNORMAL HIGH (ref 4.6–6.5)

## 2013-10-03 LAB — GLUCOSE, POCT (MANUAL RESULT ENTRY): POC Glucose: 357 mg/dl — AB (ref 70–99)

## 2013-10-03 MED ORDER — INSULIN NPH (HUMAN) (ISOPHANE) 100 UNIT/ML ~~LOC~~ SUSP: SUBCUTANEOUS | Status: DC

## 2013-10-03 MED ORDER — INSULIN REGULAR HUMAN 100 UNIT/ML IJ SOLN: 15.0000 [IU] | Freq: Three times a day (TID) | INTRAMUSCULAR | Status: DC

## 2013-10-03 NOTE — Telephone Encounter (Signed)
Patient ask if you would call her there was something she forgot to ask you.

## 2013-10-03 NOTE — Patient Instructions (Signed)
NOVOLIN N 25 UNITS IN AM AND AT BEDTIME  Regular insulin 15 units before breakfast and supper, 30 min before eating  Stop 70/30 insulin  Please check blood sugars at least half the time about 2 hours after any meal and 4 times per week on waking up. Please bring blood sugar monitor to each visit

## 2013-10-03 NOTE — Progress Notes (Signed)
Patient ID: Sierra Fisher, female   DOB: 1940/02/13, 73 y.o.   MRN: [id number]   Reason for Appointment: Diabetes follow-up   History of Present Illness   Diagnosis: Type 2 DIABETES MELITUS, date of diagnosis: ? 2001      Previous history: She has been treated with insulin for several years. Until 2013 was only taking premixed insulin with inadequate control. In 7/13 when her A1c was 11.3 % she was given Lantus insulin in addition because of relatively high fasting glucose readings and her A1c showed improved control on her last visit in 1/14. However because of her starting to get low sugars at night after an episode of gastroenteritis her Lantus was stopped  Recent history: 8/15 She has been not checked her blood sugar because of the expense getting her Freestyle strips at her pharmacy Has been quite noncompliant with her followup She does not like to take brand name medications because of cost, still taking the generic 70/30 insulin twice a day No recent A1c available According to her history her blood sugars are significantly high at all times but she has not reported these Previously had difficulty with nocturnal hypoglycemia with increasing suppertime insulin Other problems with compliance include her sometimes forgetting to take her evening shot before eating and may take it up to an hour later Still has difficulty losing weight She has been taking her metformin       Oral hypoglycemic drugs: Metformin 2-3 daily       Side effects from medications: None, not having diarrhea from metformin Insulin regimen: 70/30 insulin, a.m. 40 units; supper time: 30 units   Proper timing of medications in relation to meals:  yes, usually taking before meal but sometimes 30-60 min  Monitors blood glucose:  sporadically.    Glucometer:  FreeStyle   Insulinx  Blood Glucose readings none, not checked for the last month Past readings Fasting 220, acs 250 Hypoglycemia frequency:  none now     Meals: 2  meals per day. Not watching diet         Physical activity: exercise: walking a little           Dietician visit: Most recent: 8/13            Wt Readings from Last 3 Encounters:  10/03/13 210 lb (95.255 kg)  09/07/13 208 lb (94.348 kg)  07/29/13 208 lb 12.8 oz (94.711 kg)    LABS:  Lab Results  Component Value Date   HGBA1C 7.9* 04/12/2013   HGBA1C 7.1* 11/03/2012   HGBA1C 7.6* 10/15/2012   Lab Results  Component Value Date   MICROALBUR 0.6 04/12/2013   LDLCALC 62 02/22/2009   CREATININE 0.8 07/14/2013     Office Visit on 10/03/2013  Component Date Value Ref Range Status  . POC Glucose 10/03/2013 357* 70 - 99 mg/dl Final      Medication List       This list is accurate as of: 10/03/13 10:56 AM.  Always use your most recent med list.               ALPRAZolam 0.25 MG tablet  Commonly known as:  XANAX  TAKE ONE-HALF TO ONE TABLET BY MOUTH EVERY DAY AT BEDTIME AS NEEDED     aspirin 81 MG EC tablet  Take 81 mg by mouth daily.     carvedilol 25 MG tablet  Commonly known as:  COREG  TAKE ONE TABLET BY MOUTH TWICE DAILY  chlorthalidone 25 MG tablet  Commonly known as:  HYGROTON  Take one half tablet by mouth once daily     cyanocobalamin 1000 MCG/ML injection  Commonly known as:  (VITAMIN B-12)  1 injection monthly     DULoxetine 20 MG capsule  Commonly known as:  CYMBALTA  Take 1 capsule (20 mg total) by mouth daily.     furosemide 40 MG tablet  Commonly known as:  LASIX  Take 20 mg by mouth daily.     glucose blood test strip  Commonly known as:  FREESTYLE TEST STRIPS  Use to check blood sugars three times per day dx code 250.02     glucose monitoring kit monitoring kit  1 each by Does not apply route as needed.     hydrALAZINE 50 MG tablet  Commonly known as:  APRESOLINE  Take 50 mg by mouth 2 (two) times daily.     insulin NPH-regular Human (70-30) 100 UNIT/ML injection  Commonly known as:  NOVOLIN 70/30  Inject 40 units in am and 30 units in  pm     levothyroxine 125 MCG tablet  Commonly known as:  SYNTHROID, LEVOTHROID  TAKE ONE TABLET BY MOUTH EVERY DAY,EXCEPT TAKE ONE AND ONE-HALF TABLET ON TUESDAYS,THURSDAYS AND SATURDAYS     metFORMIN 500 MG 24 hr tablet  Commonly known as:  GLUCOPHAGE-XR  Take 1 tablet (500 mg total) by mouth daily with breakfast. Take 3 tablets daily     potassium chloride SA 20 MEQ tablet  Commonly known as:  KLOR-CON M20  Take 1 tablet (20 mEq total) by mouth daily.     pravastatin 40 MG tablet  Commonly known as:  PRAVACHOL  TAKE ONE TABLET BY MOUTH EVERY DAY     ramipril 5 MG capsule  Commonly known as:  ALTACE  (NEW) One daily for hypertension and to prevent diabetic kidney disease     traMADol 50 MG tablet  Commonly known as:  ULTRAM  TAKE ONE TABLET BY MOUTH EVERY 8 HOURS AS NEEDED FOR PAIN     Vitamin D3 400 UNITS Caps  Take by mouth daily.     vitamin E 1000 UNIT capsule  Take 1,000 Units by mouth daily.        Allergies: No Known Allergies  Past Medical History  Diagnosis Date  . CHF (congestive heart failure)     on Actos w/o complication  . Diabetes mellitus     type 2  . Thyroid disease     hypothyroidism  . Hypertension   . Anemia     B12 deficiency  . Fibromyalgia   . Bladder cancer     Past Surgical History  Procedure Laterality Date  . Abdominal hysterectomy      for ovarian cyst  . Cataract extraction  03/2004,05/2004  . Cholecystectomy    . Breast surgery    . Breast lumpectomy    . Colonoscopy       X 2; colitis X 1  . Cardiac catheterization      negative  . Bladder surgery      Family History  Problem Relation Age of Onset  . Adopted: Yes    Social History:  reports that she quit smoking about 38 years ago. She does not have any smokeless tobacco history on file. She reports that she does not drink alcohol. Her drug history is not on file.  Review of Systems:  Hypertension:  is well controlled with having her medications readjusted by  her PCP  Lipids: Currently taking pravastatin, direct LDL 97 previously    She has primary hypothyroidism, last TSH normal  Lab Results  Component Value Date   TSH 1.68 07/14/2013   She had documented vitamin B12 deficiency, not clear if this is partly from taking metformin  Seasonal depression during wintertime   Examination:   BP 126/62  Pulse 87  Temp(Src) 98 F (36.7 C)  Resp 14  Ht _0  (1.6 m)  Wt 210 lb (95.255 kg)  BMI 37.21 kg/m2  SpO2 94%  Body mass index is 37.21 kg/(m^2).   No ankle edema  ASSESSMENT/ PLAN:   Diabetes type 2   Blood glucose control is still poor and her home blood sugars are quite out of control She is really not motivated to take care of her diabetes and is continued to be noncompliant Also she appears to have difficulty financially with getting her insulin and her strips Since she has limited ability to control her sugars with premixed insulin will have her split the dose to NPH and regular Explained to her how long-acting and short-acting insulins work and how to time and dose them She is currently having significantly high readings with a total of 70 units a day and will start with a total of 80 units a day She can also do better with her diet and eats more diabetes education She is a good candidate for adding Victoza or Invokana to facilitate weight loss and better control but she does not want any brand- name medication She is not able to exercise much and has significant obesity  Recommendations today  Restart glucose monitoring as discussed  Check blood sugars consistently before or after meals since she is doing more readings before meals currently  Will start her on doses listed below and adjust them on followup. Do not think she can adjust the doses on her own  She will try to take her regular insulin 30 minutes before eating and be consistent with her doses compared to her current compliance level which is  suboptimal  Increase metformin to 3 tablets daily  Hypertension: Better controlled now  Patient Instructions  NOVOLIN N 25 UNITS IN AM AND AT BEDTIME  Regular insulin 15 units before breakfast and supper, 30 min before eating  Stop 70/30 insulin  Please check blood sugars at least half the time about 2 hours after any meal and 4 times per week on waking up. Please bring blood sugar monitor to each visit    Counseling time over 50% of today's 25 minute visit  Evonda Enge 10/03/2013, 10:56 AM   Addendum: A1c over 8% as expected  Office Visit on 10/03/2013  Component Date Value Ref Range Status  . POC Glucose 10/03/2013 357* 70 - 99 mg/dl Final  . Hemoglobin A1C 10/03/2013 8.2* 4.6 - 6.5 % Final   Glycemic Control Guidelines for People with Diabetes:Non Diabetic:  <6%Goal of Therapy: <7%Additional Action Suggested:  >8%

## 2013-10-03 NOTE — Progress Notes (Signed)
Quick Note:  Please let patient know that the lab result is 8.2, high ______

## 2013-10-11 ENCOUNTER — Other Ambulatory Visit: Payer: Self-pay | Admitting: *Deleted

## 2013-10-11 MED ORDER — METFORMIN HCL ER 500 MG PO TB24: ORAL_TABLET | ORAL | Status: DC

## 2013-10-17 ENCOUNTER — Other Ambulatory Visit: Payer: Self-pay | Admitting: Internal Medicine

## 2013-10-31 ENCOUNTER — Ambulatory Visit: Payer: Medicare Other | Admitting: Endocrinology

## 2013-11-07 ENCOUNTER — Other Ambulatory Visit: Payer: Self-pay | Admitting: *Deleted

## 2013-11-07 ENCOUNTER — Other Ambulatory Visit: Payer: Self-pay | Admitting: Internal Medicine

## 2013-11-07 MED ORDER — INSULIN NPH (HUMAN) (ISOPHANE) 100 UNIT/ML ~~LOC~~ SUSP: SUBCUTANEOUS | Status: DC

## 2013-11-11 ENCOUNTER — Ambulatory Visit (INDEPENDENT_AMBULATORY_CARE_PROVIDER_SITE_OTHER): Payer: Medicare Other | Admitting: Endocrinology

## 2013-11-11 ENCOUNTER — Encounter: Payer: Self-pay | Admitting: Endocrinology

## 2013-11-11 ENCOUNTER — Other Ambulatory Visit: Payer: Self-pay | Admitting: *Deleted

## 2013-11-11 VITALS — BP 156/79 | HR 90 | Temp 98.4°F | Resp 16 | Ht 63.0 in | Wt 211.4 lb

## 2013-11-11 DIAGNOSIS — IMO0002 Reserved for concepts with insufficient information to code with codable children: Secondary | ICD-10-CM

## 2013-11-11 DIAGNOSIS — E1165 Type 2 diabetes mellitus with hyperglycemia: Secondary | ICD-10-CM

## 2013-11-11 DIAGNOSIS — E782 Mixed hyperlipidemia: Secondary | ICD-10-CM

## 2013-11-11 MED ORDER — GLUCOSE BLOOD VI STRP: ORAL_STRIP | Status: AC

## 2013-11-11 NOTE — Patient Instructions (Signed)
NOVOLIN N 20 UNITS IN AM ......... 30 units AT BEDTIME   Regular insulin 10 units before breakfast and 20 units Before supper, 30 min before eating  Draw R insulin 1st  Please check blood sugars at least half the time about 2 hours after any meal and times per week on waking up. Please bring blood sugar monitor to each visit

## 2013-11-11 NOTE — Progress Notes (Addendum)
Patient ID: Sierra Fisher, female   DOB: 22-Dec-1940, 73 y.o.   MRN: [id number]   Reason for Appointment: Diabetes follow-up   History of Present Illness   Diagnosis: Type 2 DIABETES MELITUS, date of diagnosis: ? 2001      Previous history: She has been treated with insulin for several years. Until 2013 was only taking premixed insulin with inadequate control. In 7/13 when her A1c was 11.3 % she was given Lantus insulin in addition because of relatively high fasting glucose readings and her A1c showed improved control on her last visit in 1/14. However because of her starting to get low sugars at night after an episode of gastroenteritis her Lantus was stopped  Recent history:  Her blood sugars are still poorly controlled She does not like to take brand name medications because of cost On her last visit in 8/15 she was told to switch from 70/30 to NPH and regular; on her last visit a nonfasting glucose was 357 She did not understand the directions even though they were printed out for her and was taking only NPH insulin; had been able to get the [employer] brand and this is affordable  She did not become to prescription for the regular insulin recently because it was costing her $60 Sugars were fairly good a couple of times last month but mostly over 2 now Again she is checking her blood sugars mostly FASTING instead of at different times She has one or 2 readings in the afternoon and evening which are also relatively high Her last A1c was 8.2 Still has difficulty losing weight She has been taking her metformin       Oral hypoglycemic drugs: Metformin 2-3 daily       Side effects from medications: None, not having diarrhea from metformin Insulin regimen:     NPH insulin 25 twice a day  Monitors blood glucose:  sporadically.    Glucometer:  FreeStyle   Insulinx  Blood Glucose readings   PREMEAL Breakfast Lunch Dinner Bedtime Overall  Glucose range:  115-248    204   164, 169    Mean/median:   200      194    Hypoglycemia frequency:  none now     Meals: 2 meals per day. Not watching diet consistently, eating large meal in the evening         Physical activity: exercise: walking a little           Dietician visit: Most recent: 8/13            Wt Readings from Last 3 Encounters:  11/11/13 211 lb 6.4 oz (95.89 kg)  10/03/13 210 lb (95.255 kg)  09/07/13 208 lb (94.348 kg)    LABS:  Lab Results  Component Value Date   HGBA1C 8.2* 10/03/2013   HGBA1C 7.9* 04/12/2013   HGBA1C 7.1* 11/03/2012   Lab Results  Component Value Date   MICROALBUR 0.6 04/12/2013   LDLCALC 62 02/22/2009   CREATININE 0.8 07/14/2013     No visits with results within 1 Week(s) from this visit. Latest known visit with results is:  Office Visit on 10/03/2013  Component Date Value Ref Range Status  . POC Glucose 10/03/2013 357* 70 - 99 mg/dl Final  . Hemoglobin A1C 10/03/2013 8.2* 4.6 - 6.5 % Final   Glycemic Control Guidelines for People with Diabetes:Non Diabetic:  <6%Goal of Therapy: <7%Additional Action Suggested:  >8%       Medication List  This list is accurate as of: 11/11/13 11:59 PM.  Always use your most recent med list.               ALPRAZolam 0.25 MG tablet  Commonly known as:  XANAX  TAKE ONE-HALF TO ONE TABLET BY MOUTH EVERY DAY AT BEDTIME AS NEEDED     aspirin 81 MG EC tablet  Take 81 mg by mouth daily.     carvedilol 25 MG tablet  Commonly known as:  COREG  TAKE ONE TABLET BY MOUTH TWICE DAILY     chlorthalidone 25 MG tablet  Commonly known as:  HYGROTON  Take one half tablet by mouth once daily     cyanocobalamin 1000 MCG/ML injection  Commonly known as:  (VITAMIN B-12)  1 injection monthly     DULoxetine 20 MG capsule  Commonly known as:  CYMBALTA  Take 1 capsule (20 mg total) by mouth daily.     furosemide 40 MG tablet  Commonly known as:  LASIX  Take 20 mg by mouth daily.     glucose blood test strip  Commonly known as:  FREESTYLE INSULINX TEST   Use as instructed to check sugar 2 times per day dx code E11.65     glucose monitoring kit monitoring kit  1 each by Does not apply route as needed.     hydrALAZINE 50 MG tablet  Commonly known as:  APRESOLINE  TAKE ONE TABLET BY MOUTH THREE TIMES DAILY     insulin NPH Human 100 UNIT/ML injection  Commonly known as:  NOVOLIN N  Inject 25 units in am and pm     insulin regular 100 units/mL injection  Commonly known as:  NOVOLIN R  Inject 0.15 mLs (15 Units total) into the skin 3 (three) times daily before meals.     KLOR-CON M20 20 MEQ tablet  Generic drug:  potassium chloride SA  TAKE ONE TABLET BY MOUTH ONCE DAILY     levothyroxine 125 MCG tablet  Commonly known as:  SYNTHROID, LEVOTHROID  TAKE ONE TABLET BY MOUTH EVERY DAY,EXCEPT TAKE ONE AND ONE-HALF TABLET ON TUESDAYS,THURSDAYS AND SATURDAYS     metFORMIN 500 MG 24 hr tablet  Commonly known as:  GLUCOPHAGE-XR  Take 3 tablets daily     pravastatin 40 MG tablet  Commonly known as:  PRAVACHOL  TAKE ONE TABLET BY MOUTH EVERY DAY     ramipril 5 MG capsule  Commonly known as:  ALTACE  TAKE ONE CAPSULE BY MOUTH DAILY FOR HYPERTENSION AND TO PREVENT DIABETIC KIDNEY DISEASE     traMADol 50 MG tablet  Commonly known as:  ULTRAM  TAKE ONE TABLET BY MOUTH EVERY 8 HOURS AS NEEDED FOR PAIN     Vitamin D3 400 UNITS Caps  Take by mouth daily.     vitamin E 1000 UNIT capsule  Take 1,000 Units by mouth daily.        Allergies: No Known Allergies  Past Medical History  Diagnosis Date  . CHF (congestive heart failure)     on Actos w/o complication  . Diabetes mellitus     type 2  . Thyroid disease     hypothyroidism  . Hypertension   . Anemia     B12 deficiency  . Fibromyalgia   . Bladder cancer     Past Surgical History  Procedure Laterality Date  . Abdominal hysterectomy      for ovarian cyst  . Cataract extraction  03/2004,05/2004  . Cholecystectomy    .  Breast surgery    . Breast lumpectomy    .  Colonoscopy       X 2; colitis X 1  . Cardiac catheterization      negative  . Bladder surgery      Family History  Problem Relation Age of Onset  . Adopted: Yes    Social History:  reports that she quit smoking about 38 years ago. She does not have any smokeless tobacco history on file. She reports that she does not drink alcohol. Her drug history is not on file.  Review of Systems:  Hypertension:   her medications are adjusted by her PCP  Lipids: Currently taking pravastatin, has not had any recent followup  with PCP  Lab Results  Component Value Date   CHOL 172 10/19/2012   HDL 45.30 10/19/2012   LDLCALC 62 02/22/2009   LDLDIRECT 96.7 10/19/2012   TRIG 230.0* 10/19/2012   CHOLHDL 4 10/19/2012      She has primary hypothyroidism, last TSH normal  Lab Results  Component Value Date   TSH 1.68 07/14/2013   She had documented vitamin B12 deficiency, not clear if this is partly from taking metformin  Seasonal depression during wintertime   Examination:   BP 156/79  Pulse 90  Temp(Src) 98.4 F (36.9 C)  Resp 16  Ht 5' 3"  (1.6 m)  Wt 211 lb 6.4 oz (95.89 kg)  BMI 37.46 kg/m2  SpO2 95%  Body mass index is 37.46 kg/(m^2).   No ankle edema  ASSESSMENT/ PLAN:   Diabetes type 2   Blood glucose control is still poor and her home blood sugars are mostly over 200 She did not follow instructions for her insulin and has not used the regular insulin that was prescribed because of cost Again explained to her that premixed insulin does not control her glucose evenly and would tend to cause overnight hypoglycemia and morning hyperglycemia She has checked her sugar at home somewhat but mostly in the morning despite explaining to her that she needs more postprandial readings She probably needs a little more NPH and regular insulin in the evenings since most of her sugars are higher overnight and her main meal is in the evening She can also do better with her diet and eats more  diabetes education She is a good candidate for adding Victoza or Invokana to facilitate weight loss and better control but she does not want any brand- name medication She is not able to exercise much and has significant obesity  Recommendations today  Continue glucose monitoring FreeStyle monitor, prescription sent. Also given her the option to get the [employer] brand meter but she will have to keep a written record of her blood sugars; to check blood sugars as discussed  Start taking regular insulin before breakfast and supper. She can get the [employer] brand  She can mix regular and NPH insulin in the morning and explained to her how to do this  Continue metformin 3 tablets daily  She will need reinforcement of her instructions, adjustment of insulin and general diabetes education with nursing educator in 10 days  Low fat balanced meals  Check lipids on the next visit and also followup A1c  Hypertension: Blood pressure is relatively high today, followed by PCP  Patient Instructions  NOVOLIN N 20 UNITS IN AM ......... 30 units AT BEDTIME   Regular insulin 10 units before breakfast and 20 units Before supper, 30 min before eating  Draw R insulin 1st  Please  check blood sugars at least half the time about 2 hours after any meal and times per week on waking up. Please bring blood sugar monitor to each visit     Counseling time over 50% of today's 25 minute visit  Maliya Marich 11/12/2013, 1:46 PM

## 2013-11-30 ENCOUNTER — Ambulatory Visit: Payer: Medicare Other | Admitting: Nutrition

## 2013-12-10 ENCOUNTER — Other Ambulatory Visit: Payer: Self-pay | Admitting: Internal Medicine

## 2013-12-23 ENCOUNTER — Other Ambulatory Visit: Payer: Medicare Other

## 2013-12-28 ENCOUNTER — Ambulatory Visit: Payer: Medicare Other | Admitting: Endocrinology

## 2014-01-10 ENCOUNTER — Ambulatory Visit (INDEPENDENT_AMBULATORY_CARE_PROVIDER_SITE_OTHER): Payer: Medicare Other | Admitting: Internal Medicine

## 2014-01-10 ENCOUNTER — Other Ambulatory Visit (INDEPENDENT_AMBULATORY_CARE_PROVIDER_SITE_OTHER): Payer: Medicare Other

## 2014-01-10 ENCOUNTER — Encounter: Payer: Self-pay | Admitting: Internal Medicine

## 2014-01-10 VITALS — BP 140/70 | HR 90 | Temp 97.8°F | Resp 15 | Ht 63.0 in | Wt 210.2 lb

## 2014-01-10 DIAGNOSIS — E782 Mixed hyperlipidemia: Secondary | ICD-10-CM

## 2014-01-10 DIAGNOSIS — M791 Myalgia: Secondary | ICD-10-CM

## 2014-01-10 DIAGNOSIS — I10 Essential (primary) hypertension: Secondary | ICD-10-CM | POA: Diagnosis not present

## 2014-01-10 DIAGNOSIS — IMO0001 Reserved for inherently not codable concepts without codable children: Secondary | ICD-10-CM

## 2014-01-10 DIAGNOSIS — D509 Iron deficiency anemia, unspecified: Secondary | ICD-10-CM

## 2014-01-10 DIAGNOSIS — M609 Myositis, unspecified: Secondary | ICD-10-CM | POA: Diagnosis not present

## 2014-01-10 DIAGNOSIS — E039 Hypothyroidism, unspecified: Secondary | ICD-10-CM

## 2014-01-10 DIAGNOSIS — E538 Deficiency of other specified B group vitamins: Secondary | ICD-10-CM

## 2014-01-10 LAB — CBC WITH DIFFERENTIAL/PLATELET
BASOS PCT: 0.8 % (ref 0.0–3.0)
Basophils Absolute: 0.1 10*3/uL (ref 0.0–0.1)
EOS ABS: 0.2 10*3/uL (ref 0.0–0.7)
EOS PCT: 1.9 % (ref 0.0–5.0)
HCT: 32.3 % — ABNORMAL LOW (ref 36.0–46.0)
HEMOGLOBIN: 10.1 g/dL — AB (ref 12.0–15.0)
Lymphocytes Relative: 17.8 % (ref 12.0–46.0)
Lymphs Abs: 2.3 10*3/uL (ref 0.7–4.0)
MCHC: 31.2 g/dL (ref 30.0–36.0)
MCV: 66.7 fl — ABNORMAL LOW (ref 78.0–100.0)
MONOS PCT: 10.1 % (ref 3.0–12.0)
Monocytes Absolute: 1.3 10*3/uL — ABNORMAL HIGH (ref 0.1–1.0)
NEUTROS ABS: 8.8 10*3/uL — AB (ref 1.4–7.7)
NEUTROS PCT: 69.4 % (ref 43.0–77.0)
Platelets: 354 10*3/uL (ref 150.0–400.0)
RBC: 4.84 Mil/uL (ref 3.87–5.11)
RDW: 17.4 % — ABNORMAL HIGH (ref 11.5–15.5)
WBC: 12.7 10*3/uL — AB (ref 4.0–10.5)

## 2014-01-10 LAB — IBC PANEL
IRON: 20 ug/dL — AB (ref 42–145)
Saturation Ratios: 4.2 % — ABNORMAL LOW (ref 20.0–50.0)
TRANSFERRIN: 338.1 mg/dL (ref 212.0–360.0)

## 2014-01-10 LAB — LIPID PANEL
CHOL/HDL RATIO: 4
Cholesterol: 141 mg/dL (ref 0–200)
HDL: 38 mg/dL — ABNORMAL LOW (ref >39.00)
LDL Cholesterol: 65 mg/dL (ref 0–99)
NONHDL: 103
Triglycerides: 191 mg/dL — ABNORMAL HIGH (ref 0.0–149.0)
VLDL: 38.2 mg/dL (ref 0.0–40.0)

## 2014-01-10 LAB — BASIC METABOLIC PANEL
BUN: 10 mg/dL (ref 6–23)
CALCIUM: 10 mg/dL (ref 8.4–10.5)
CO2: 26 meq/L (ref 19–32)
CREATININE: 0.8 mg/dL (ref 0.4–1.2)
Chloride: 97 mEq/L (ref 96–112)
GFR: 72.56 mL/min (ref >60.00)
Glucose, Bld: 240 mg/dL — ABNORMAL HIGH (ref 70–99)
Potassium: 4.2 mEq/L (ref 3.5–5.1)
SODIUM: 133 meq/L — AB (ref 135–145)

## 2014-01-10 LAB — HEPATIC FUNCTION PANEL
ALBUMIN: 3.9 g/dL (ref 3.5–5.2)
ALT: 24 U/L (ref 0–35)
AST: 20 U/L (ref 0–37)
Alkaline Phosphatase: 71 U/L (ref 39–117)
Bilirubin, Direct: 0.1 mg/dL (ref 0.0–0.3)
TOTAL PROTEIN: 7.4 g/dL (ref 6.0–8.3)
Total Bilirubin: 0.5 mg/dL (ref 0.2–1.2)

## 2014-01-10 LAB — CK: Total CK: 57 U/L (ref 7–177)

## 2014-01-10 LAB — TSH: TSH: 2.05 u[IU]/mL (ref 0.35–4.50)

## 2014-01-10 LAB — VITAMIN B12: Vitamin B-12: 171 pg/mL — ABNORMAL LOW (ref 211–911)

## 2014-01-10 NOTE — Patient Instructions (Signed)
Your next office appointment will be determined based upon review of your pending labs. Those instructions will be transmitted to you by mail. Minimal Blood Pressure Goal= AVERAGE < 140/90;  Ideal is an AVERAGE < 135/85. reliable.It  can also be checked  at the pharmacy.

## 2014-01-10 NOTE — Assessment & Plan Note (Signed)
B12 level 

## 2014-01-10 NOTE — Assessment & Plan Note (Signed)
IBC 

## 2014-01-10 NOTE — Assessment & Plan Note (Signed)
CK

## 2014-01-10 NOTE — Assessment & Plan Note (Signed)
Blood pressure goals reviewed. BMET 

## 2014-01-10 NOTE — Progress Notes (Signed)
   Subjective:    Patient ID: Sierra Fisher, female    DOB: 06/27/1940, 73 y.o.   MRN: [id number]  HPI She is here to assess active health issues & conditions. PMH, FH, & Social history verified & updated   Dr. Dwyane Dee is monitoring her diabetes. Her last A1c was 8.2% in August and she is due for follow-up  She is on a low-carb diet. There is no regular exercise. She's not monitoring blood pressure.  She is paying for her monthly B12 shots as insurance will not cover it despite a history of B12 deficiency.  She also has history of iron deficiency. Her last hemoglobin and hematocrit were 10.3/31.9 in June of this year.  She has a remote history of colitis; she has not had colonoscopy in the recent past. Her only GI symptoms are irritable bowel type which have responded to a probiotic.  She has been compliant with her thyroid supplementation but describes profound fatigue.  She also has stiffness of her hands. She has occasional cramping the right lower extremity but no diffuse myalgias.  Her last lipids were in September 2014. HDL was 45.3, TGs 230, and LDL 96.7.      Review of Systems  Significant headaches, epistaxis, chest pain, palpitations, exertional dyspnea, claudication, paroxysmal nocturnal dyspnea, or edema absent. No GI symptoms , memory loss or myalgias   Unexplained weight loss, abdominal pain, significant dyspepsia, dysphagia, melena, rectal bleeding, or persistently small caliber stools are denied.     Objective:   Physical Exam   Positive or pertinent findings include: She has bilateral ptosis. Abdomen is protuberant. She has slight varus changes in the knees. There is a ganglion over the third left finger laterally. The reflexes are 1/2+.   General appearance :adequately nourished; in no distress. Eyes: No conjunctival inflammation or scleral icterus is present. Oral exam: Dental hygiene is good. Lips and gums are healthy appearing.There is no oropharyngeal  erythema or exudate noted.  Heart:  Normal rate and regular rhythm. S1 and S2 normal without gallop, murmur, click, rub or other extra sounds   Lungs:Chest clear to auscultation; no wheezes, rhonchi,rales ,or rubs present.No increased work of breathing.  Abdomen: bowel sounds normal, soft and non-tender without masses, organomegaly or hernias noted.  No guarding or rebound. No flank tenderness to percussion. Vascular : all pulses equal ; no bruits present. Skin:Warm & dry.  Intact without suspicious lesions or rashes ; no jaundice or tenting Lymphatic: No lymphadenopathy is noted about the head, neck, axilla              Assessment & Plan:  See Current Assessment & Plan in Problem List under specific Diagnosis

## 2014-01-10 NOTE — Progress Notes (Signed)
Pre visit review using our clinic review tool, if applicable. No additional management support is needed unless otherwise documented below in the visit note. 

## 2014-01-10 NOTE — Assessment & Plan Note (Signed)
Lipids, LFTs, TSH ,CK 

## 2014-01-11 ENCOUNTER — Other Ambulatory Visit: Payer: Self-pay | Admitting: Internal Medicine

## 2014-01-11 DIAGNOSIS — D509 Iron deficiency anemia, unspecified: Secondary | ICD-10-CM

## 2014-01-11 DIAGNOSIS — K529 Noninfective gastroenteritis and colitis, unspecified: Secondary | ICD-10-CM

## 2014-01-12 ENCOUNTER — Telehealth: Payer: Self-pay | Admitting: Internal Medicine

## 2014-01-12 DIAGNOSIS — I10 Essential (primary) hypertension: Secondary | ICD-10-CM

## 2014-01-12 DIAGNOSIS — E038 Other specified hypothyroidism: Secondary | ICD-10-CM

## 2014-01-12 MED ORDER — HYDRALAZINE HCL 50 MG PO TABS: 50.0000 mg | ORAL_TABLET | Freq: Three times a day (TID) | ORAL | Status: DC

## 2014-01-12 MED ORDER — POTASSIUM CHLORIDE CRYS ER 20 MEQ PO TBCR: 20.0000 meq | EXTENDED_RELEASE_TABLET | Freq: Every day | ORAL | Status: DC

## 2014-01-12 MED ORDER — LEVOTHYROXINE SODIUM 125 MCG PO TABS: ORAL_TABLET | ORAL | Status: DC

## 2014-01-12 MED ORDER — CARVEDILOL 25 MG PO TABS: ORAL_TABLET | ORAL | Status: DC

## 2014-01-12 NOTE — Telephone Encounter (Signed)
Refill X 6 mos I am not treating her Diabetes

## 2014-01-12 NOTE — Telephone Encounter (Signed)
Phone call to patient. Gave her the results of her lab work. She needs refills on her medications. Okay to refill? Any med changes?

## 2014-01-12 NOTE — Telephone Encounter (Signed)
Patient would like results of lab work as soon as possible b/c she had to get her medicine refilled.  Thanks!

## 2014-01-16 ENCOUNTER — Telehealth: Payer: Self-pay | Admitting: Internal Medicine

## 2014-01-16 ENCOUNTER — Other Ambulatory Visit: Payer: Self-pay | Admitting: Internal Medicine

## 2014-01-16 DIAGNOSIS — M25512 Pain in left shoulder: Secondary | ICD-10-CM

## 2014-01-16 DIAGNOSIS — F411 Generalized anxiety disorder: Secondary | ICD-10-CM

## 2014-01-16 DIAGNOSIS — M25511 Pain in right shoulder: Secondary | ICD-10-CM

## 2014-01-16 DIAGNOSIS — M546 Pain in thoracic spine: Secondary | ICD-10-CM

## 2014-01-16 NOTE — Telephone Encounter (Signed)
This is not an option if she takes Tramadol because of potential drug:drug interaction

## 2014-01-16 NOTE — Telephone Encounter (Signed)
Pt requesting Cymbalta, she said she mentioned at a prev appt but she lost the prescription and requested at last ov but has not been refilled yet. [facility].

## 2014-01-17 MED ORDER — DULOXETINE HCL 20 MG PO CPEP: 20.0000 mg | ORAL_CAPSULE | Freq: Every day | ORAL | Status: DC

## 2014-01-17 NOTE — Telephone Encounter (Signed)
Patient has been advised of you response. She states she will not take the Tramadol then because Cymbalta is what she wants to try

## 2014-01-17 NOTE — Telephone Encounter (Signed)
Left message for patient to return call.

## 2014-01-17 NOTE — Telephone Encounter (Signed)
Patient has been advised Cymbalta is being routed to her [facility] [place] listed on her chart

## 2014-01-17 NOTE — Telephone Encounter (Signed)
Generic Cymbalta 20 mg #30, R X2

## 2014-01-30 ENCOUNTER — Telehealth: Payer: Self-pay

## 2014-01-30 NOTE — Telephone Encounter (Signed)
lvm for pt to call back.   RE: diabetic eye exam. With whom and where?

## 2014-01-31 ENCOUNTER — Other Ambulatory Visit: Payer: Self-pay | Admitting: Endocrinology

## 2014-02-07 NOTE — Telephone Encounter (Signed)
Pt stated that Herbert [facility] did her last eye exam. Not sure when.   Called [facility] at [phone] and lm with medical records.

## 2014-02-28 ENCOUNTER — Other Ambulatory Visit: Payer: Self-pay | Admitting: Endocrinology

## 2014-03-22 ENCOUNTER — Telehealth: Payer: Self-pay | Admitting: Internal Medicine

## 2014-03-22 ENCOUNTER — Other Ambulatory Visit: Payer: Self-pay | Admitting: Internal Medicine

## 2014-03-22 MED ORDER — CHLORTHALIDONE 25 MG PO TABS: 12.5000 mg | ORAL_TABLET | Freq: Every day | ORAL | Status: DC

## 2014-03-22 NOTE — Telephone Encounter (Signed)
Pt called in requesting refill on her chlorthalidone (HYGROTON) 25 MG tablet [[id number],    I wasn't sure if pt would need an appt to get this refilled or not?

## 2014-03-24 ENCOUNTER — Other Ambulatory Visit: Payer: Self-pay | Admitting: Gastroenterology

## 2014-03-28 ENCOUNTER — Telehealth: Payer: Self-pay | Admitting: Internal Medicine

## 2014-03-28 MED ORDER — CYANOCOBALAMIN 1000 MCG/ML IJ SOLN: INTRAMUSCULAR | Status: DC

## 2014-03-28 NOTE — Telephone Encounter (Signed)
Patient requesting refill of cyanocobalamin (,VITAMIN B-12,) 1000 MCG/ML injection [[id number]] to [facility] @ [address] and call patient to advise

## 2014-03-28 NOTE — Telephone Encounter (Signed)
Medication has been sent to [facility] on [address]

## 2014-04-06 ENCOUNTER — Other Ambulatory Visit: Payer: Self-pay | Admitting: Endocrinology

## 2014-04-11 ENCOUNTER — Other Ambulatory Visit: Payer: Self-pay | Admitting: Endocrinology

## 2014-04-14 ENCOUNTER — Encounter (HOSPITAL_COMMUNITY): Payer: Self-pay | Admitting: *Deleted

## 2014-04-17 ENCOUNTER — Telehealth: Payer: Self-pay | Admitting: Endocrinology

## 2014-04-17 ENCOUNTER — Other Ambulatory Visit: Payer: Self-pay | Admitting: Endocrinology

## 2014-04-17 NOTE — Telephone Encounter (Signed)
Pt needs metformin called into [facility] 500 mg 3 times daily

## 2014-04-17 NOTE — Telephone Encounter (Signed)
Patient needs appointment before rx can be refilled,

## 2014-04-21 ENCOUNTER — Encounter (HOSPITAL_COMMUNITY): Payer: Self-pay | Admitting: Anesthesiology

## 2014-04-21 ENCOUNTER — Ambulatory Visit (HOSPITAL_COMMUNITY)
Admission: RE | Admit: 2014-04-21 | Discharge: 2014-04-21 | Disposition: A | Discharge status: Home / Self Care | Payer: Medicare Other | Source: Ambulatory Visit | Attending: Gastroenterology | Admitting: Gastroenterology

## 2014-04-21 ENCOUNTER — Encounter (HOSPITAL_COMMUNITY)
Admission: RE | Disposition: A | Discharge status: Home / Self Care | Payer: Self-pay | Source: Ambulatory Visit | Attending: Gastroenterology

## 2014-04-21 ENCOUNTER — Ambulatory Visit (HOSPITAL_COMMUNITY): Payer: Medicare Other | Admitting: Anesthesiology

## 2014-04-21 DIAGNOSIS — Z87891 Personal history of nicotine dependence: Secondary | ICD-10-CM | POA: Insufficient documentation

## 2014-04-21 DIAGNOSIS — Z79899 Other long term (current) drug therapy: Secondary | ICD-10-CM | POA: Diagnosis not present

## 2014-04-21 DIAGNOSIS — I509 Heart failure, unspecified: Secondary | ICD-10-CM | POA: Insufficient documentation

## 2014-04-21 DIAGNOSIS — M797 Fibromyalgia: Secondary | ICD-10-CM | POA: Insufficient documentation

## 2014-04-21 DIAGNOSIS — E039 Hypothyroidism, unspecified: Secondary | ICD-10-CM | POA: Insufficient documentation

## 2014-04-21 DIAGNOSIS — D509 Iron deficiency anemia, unspecified: Secondary | ICD-10-CM | POA: Diagnosis present

## 2014-04-21 DIAGNOSIS — I10 Essential (primary) hypertension: Secondary | ICD-10-CM | POA: Diagnosis not present

## 2014-04-21 DIAGNOSIS — Z800 Family history of malignant neoplasm of digestive organs: Secondary | ICD-10-CM | POA: Diagnosis not present

## 2014-04-21 DIAGNOSIS — D120 Benign neoplasm of cecum: Secondary | ICD-10-CM | POA: Insufficient documentation

## 2014-04-21 DIAGNOSIS — E119 Type 2 diabetes mellitus without complications: Secondary | ICD-10-CM | POA: Insufficient documentation

## 2014-04-21 DIAGNOSIS — Z8551 Personal history of malignant neoplasm of bladder: Secondary | ICD-10-CM | POA: Diagnosis not present

## 2014-04-21 DIAGNOSIS — Z7982 Long term (current) use of aspirin: Secondary | ICD-10-CM | POA: Diagnosis not present

## 2014-04-21 DIAGNOSIS — K5730 Diverticulosis of large intestine without perforation or abscess without bleeding: Secondary | ICD-10-CM | POA: Insufficient documentation

## 2014-04-21 DIAGNOSIS — Z794 Long term (current) use of insulin: Secondary | ICD-10-CM | POA: Diagnosis not present

## 2014-04-21 HISTORY — PX: COLONOSCOPY WITH PROPOFOL: SHX5780

## 2014-04-21 HISTORY — PX: ESOPHAGOGASTRODUODENOSCOPY (EGD) WITH PROPOFOL: SHX5813

## 2014-04-21 LAB — GLUCOSE, CAPILLARY: Glucose-Capillary: 190 mg/dL — ABNORMAL HIGH (ref 70–99)

## 2014-04-21 SURGERY — ESOPHAGOGASTRODUODENOSCOPY (EGD) WITH PROPOFOL
Anesthesia: Monitor Anesthesia Care

## 2014-04-21 MED ORDER — PROPOFOL INFUSION 10 MG/ML OPTIME
INTRAVENOUS | Status: DC | PRN
  Administered 2014-04-21: 150 ug/kg/min via INTRAVENOUS

## 2014-04-21 MED ORDER — SODIUM CHLORIDE 0.9 % IV SOLN: INTRAVENOUS | Status: DC

## 2014-04-21 MED ORDER — PROPOFOL 10 MG/ML IV BOLUS
INTRAVENOUS | Status: AC
  Filled 2014-04-21: qty 20

## 2014-04-21 MED ORDER — LACTATED RINGERS IV SOLN
INTRAVENOUS | Status: DC | PRN
  Administered 2014-04-21: 10:00:00 via INTRAVENOUS

## 2014-04-21 MED ORDER — PHENYLEPHRINE HCL 10 MG/ML IJ SOLN
INTRAMUSCULAR | Status: DC | PRN
  Administered 2014-04-21 (×2): 80 ug via INTRAVENOUS

## 2014-04-21 MED ORDER — ONDANSETRON HCL 4 MG/2ML IJ SOLN
INTRAMUSCULAR | Status: DC | PRN
  Administered 2014-04-21: 4 mg via INTRAVENOUS

## 2014-04-21 MED ORDER — KETAMINE HCL 10 MG/ML IJ SOLN
INTRAMUSCULAR | Status: DC | PRN
  Administered 2014-04-21: 20 mg via INTRAVENOUS

## 2014-04-21 MED ORDER — PHENYLEPHRINE 40 MCG/ML (10ML) SYRINGE FOR IV PUSH (FOR BLOOD PRESSURE SUPPORT)
PREFILLED_SYRINGE | INTRAVENOUS | Status: AC
  Filled 2014-04-21: qty 20

## 2014-04-21 SURGICAL SUPPLY — 24 items

## 2014-04-21 NOTE — Anesthesia Postprocedure Evaluation (Signed)
  Anesthesia Post-op Note  Patient: Sierra Fisher  Procedure(s) Performed: Procedure(s) (LRB): ESOPHAGOGASTRODUODENOSCOPY (EGD) WITH PROPOFOL (N/A) COLONOSCOPY WITH PROPOFOL (N/A)  Patient Location: PACU  Anesthesia Type: MAC  Level of Consciousness: awake and alert   Airway and Oxygen Therapy: Patient Spontanous Breathing  Post-op Pain: mild  Post-op Assessment: Post-op Vital signs reviewed, Patient's Cardiovascular Status Stable, Respiratory Function Stable, Patent Airway and No signs of Nausea or vomiting  Last Vitals:  Filed Vitals:   04/21/14 1120  BP: 205/89  Pulse: 80  Temp:   Resp: 18    Post-op Vital Signs: stable   Complications: No apparent anesthesia complications

## 2014-04-21 NOTE — Anesthesia Preprocedure Evaluation (Addendum)
Anesthesia Evaluation  Patient identified by MRN, date of birth, ID band Patient awake    Reviewed: Allergy & Precautions, NPO status , Patient's Chart, lab work & pertinent test results  Airway Mallampati: II  TM Distance: >3 FB Neck ROM: Full    Dental no notable dental hx. (+) Missing   Pulmonary neg pulmonary ROS, former smoker,  breath sounds clear to auscultation  Pulmonary exam normal       Cardiovascular hypertension, Pt. on medications +CHF Rhythm:Regular Rate:Normal     Neuro/Psych negative neurological ROS  negative psych ROS   GI/Hepatic negative GI ROS, Neg liver ROS,   Endo/Other  diabetes, Type 2, Oral Hypoglycemic Agents  Renal/GU negative Renal ROS  negative genitourinary   Musculoskeletal  (+) Fibromyalgia -  Abdominal   Peds negative pediatric ROS (+)  Hematology negative hematology ROS (+)   Anesthesia Other Findings   Reproductive/Obstetrics negative OB ROS                            Anesthesia Physical Anesthesia Plan  ASA: III  Anesthesia Plan: MAC   Post-op Pain Management:    Induction:   Airway Management Planned:   Additional Equipment:   Intra-op Plan:   Post-operative Plan:   Informed Consent: I have reviewed the patients History and Physical, chart, labs and discussed the procedure including the risks, benefits and alternatives for the proposed anesthesia with the patient or authorized representative who has indicated his/her understanding and acceptance.   Dental advisory given  Plan Discussed with: CRNA  Anesthesia Plan Comments:         Anesthesia Quick Evaluation

## 2014-04-21 NOTE — Transfer of Care (Signed)
Immediate Anesthesia Transfer of Care Note  Patient: Sierra Fisher  Procedure(s) Performed: Procedure(s) (LRB): ESOPHAGOGASTRODUODENOSCOPY (EGD) WITH PROPOFOL (N/A) COLONOSCOPY WITH PROPOFOL (N/A)  Patient Location: PACU  Anesthesia Type: MAC  Level of Consciousness: sedated, patient cooperative and responds to stimulation  Airway & Oxygen Therapy: Patient Spontanous Breathing and Patient connected to face mask oxgen  Post-op Assessment: Report given to PACU RN and Post -op Vital signs reviewed and stable  Post vital signs: Reviewed and stable  Complications: No apparent anesthesia complications

## 2014-04-21 NOTE — Op Note (Signed)
[facility] [place], [address]   ENDOSCOPY PROCEDURE REPORT  PATIENT: Sierra, Fisher  MR#: [id number] BIRTHDATE: Dec 17, 1940 , 60  yrs. old GENDER: female ENDOSCOPIST:Aadvika Konen Benson [place], MD REFERRED BY: PROCEDURE DATE:  Apr 22, 2014 PROCEDURE:   EGD w/ biopsy ASA CLASS:    Class III INDICATIONS: iron deficiency anemia. MEDICATION: Monitored anesthesia care TOPICAL ANESTHETIC:  DESCRIPTION OF PROCEDURE:   After the risks and benefits of the procedure were explained, informed consent was obtained.  The Pentax Gastroscope [id number]  endoscope was introduced through the mouth  and advanced to the second portion of the duodenum .  The instrument was slowly withdrawn as the mucosa was fully examined. Estimated blood loss is zero unless otherwise noted in this procedure report.    FINDINGS: The esophagus and gastroesophageal junction were completely normal in appearance.  The stomach was entered and closely examined.The antrum, angularis, and lesser curvature were well visualized, including a retroflexed view of the cardia and fundus.  The stomach wall was normally distensable.  The scope passed easily through the pylorus into the duodenum.  Random biopsies of the duodenum were obtained to evaluate for Celiac disease.    Retroflexed views revealed no abnormalities.    The scope was then withdrawn from the patient and the procedure completed.  COMPLICATIONS: There were no immediate complications.  ENDOSCOPIC IMPRESSION: 1) Normal EGD. RECOMMENDATIONS: 1) Proceed with the colonoscopy.  _______________________________ eSignedCarol Ada, MD Apr 22, 2014 10:45 AM     cc:  CPT CODES: ICD CODES:  The ICD and CPT codes recommended by this software are interpretations from the data that the clinical staff has captured with the software.  The verification of the translation of this report to the ICD and CPT codes and modifiers is the  sole responsibility of the health care institution and practicing physician where this report was generated.  [employer]. will not be held responsible for the validity of the ICD and CPT codes included on this report.  AMA assumes no liability for data contained or not contained herein. CPT is a Designer, television/film set of the [employer].

## 2014-04-21 NOTE — Op Note (Signed)
[facility] [place], [address]   COLONOSCOPY PROCEDURE REPORT  PATIENT: Sierra Fisher, Sierra Fisher  MR#: [id number] BIRTHDATE: 06/20/1940 , 24  yrs. old GENDER: female ENDOSCOPIST: Carol Ada, MD REFERRED BY: PROCEDURE DATE:  05-20-2014 PROCEDURE:   Colonoscopy with snare polypectomy ASA CLASS:   Class III INDICATIONS:iron deficiency anemia. MEDICATIONS: Monitored anesthesia care  DESCRIPTION OF PROCEDURE:   After the risks and benefits and of the procedure were explained, informed consent was obtained.  revealed no abnormalities of the rectum.    The Pentax Ped Colon [id number] endoscope was introduced through the anus and advanced to the cecum, which was identified by both the appendix and ileocecal valve .  The quality of the prep was excellent. .  The instrument was then slowly withdrawn as the colon was fully examined. Estimated blood loss is zero unless otherwise noted in this procedure report.   FINDINGS: The sigmoid colon was very difficult to traverse.  At the appendiceal orifice a polypoid lesion was identified and it was removed with a cold snare.  The lesion measureed 3 mm.  Scattered left-sided diverticula were found.  No evidence of any masses, inflammation, ulcerations, erosions, or vascular abnormalities. Retroflexed views revealed no abnormalities.     The scope was then withdrawn from the patient and the procedure completed.  WITHDRAWAL TIME:  COMPLICATIONS: There were no immediate complications. ENDOSCOPIC IMPRESSION: 1) Polyp. 2) Diverticula.  RECOMMENDATIONS: 1) Capsule endoscopy. 2) Await biopsy results.  REPEAT EXAM:  cc:  _______________________________ eSignedCarol Ada, MD 05-20-14 10:42 AM   CPT CODES: ICD CODES:  The ICD and CPT codes recommended by this software are interpretations from the data that the clinical staff has captured with the software.  The verification of the translation of this report  to the ICD and CPT codes and modifiers is the sole responsibility of the health care institution and practicing physician where this report was generated.  [employer]. will not be held responsible for the validity of the ICD and CPT codes included on this report.  AMA assumes no liability for data contained or not contained herein. CPT is a Designer, television/film set of the [employer].   PATIENT NAME:  Sierra Fisher, Sierra Fisher MR#: [id number]

## 2014-04-21 NOTE — Discharge Instructions (Signed)
Colonoscopy, Care After °These instructions give you information on caring for yourself after your procedure. Your doctor may also give you more specific instructions. Call your doctor if you have any problems or questions after your procedure. °HOME CARE °· Do not drive for 24 hours. °· Do not sign important papers or use machinery for 24 hours. °· You may shower. °· You may go back to your usual activities, but go slower for the first 24 hours. °· Take rest breaks often during the first 24 hours. °· Walk around or use warm packs on your belly (abdomen) if you have belly cramping or gas. °· Drink enough fluids to keep your pee (urine) clear or pale yellow. °· Resume your normal diet. Avoid heavy or fried foods. °· Avoid drinking alcohol for 24 hours or as told by your doctor. °· Only take medicines as told by your doctor. °If a tissue sample (biopsy) was taken during the procedure:  °· Do not take aspirin or blood thinners for 7 days, or as told by your doctor. °· Do not drink alcohol for 7 days, or as told by your doctor. °· Eat soft foods for the first 24 hours. °GET HELP IF: °You still have a small amount of blood in your poop (stool) 2-3 days after the procedure. °GET HELP RIGHT AWAY IF: °· You have more than a small amount of blood in your poop. °· You see clumps of tissue (blood clots) in your poop. °· Your belly is puffy (swollen). °· You feel sick to your stomach (nauseous) or throw up (vomit). °· You have a fever. °· You have belly pain that gets worse and medicine does not help. °MAKE SURE YOU: °· Understand these instructions. °· Will watch your condition. °· Will get help right away if you are not doing well or get worse. °Document Released: 02/22/2010 Document Revised: 01/25/2013 Document Reviewed: 09/27/2012 °ExitCare® Patient Information ©2015 [employer]. This information is not intended to replace advice given to you by your health care provider. Make sure you discuss any questions you have with  your health care provider. °Esophagogastroduodenoscopy °Care After °Refer to this sheet in the next few weeks. These instructions provide you with information on caring for yourself after your procedure. Your caregiver may also give you more specific instructions. Your treatment has been planned according to current medical practices, but problems sometimes occur. Call your caregiver if you have any problems or questions after your procedure.  °HOME CARE INSTRUCTIONS °· Do not eat or drink anything until the numbing medicine (local anesthetic) has worn off and your gag reflex has returned. You will know that the local anesthetic has worn off when you can swallow comfortably. °· Do not drive for 12 hours after the procedure or as directed by your caregiver. °· Only take medicines as directed by your caregiver. °SEEK MEDICAL CARE IF:  °· You cannot stop coughing. °· You are not urinating at all or less than usual. °SEEK IMMEDIATE MEDICAL CARE IF: °· You have difficulty swallowing. °· You cannot eat or drink. °· You have worsening throat or chest pain. °· You have dizziness, lightheadedness, or you faint. °· You have nausea or vomiting. °· You have chills. °· You have a fever. °· You have severe abdominal pain. °· You have black, tarry, or bloody stools. °Document Released: 01/07/2012 Document Reviewed: 01/07/2012 °ExitCare® Patient Information ©2015 [employer]. This information is not intended to replace advice given to you by your health care provider. Make sure you   discuss any questions you have with your health care provider. ° °

## 2014-04-21 NOTE — H&P (Signed)
  Sierra Fisher HPI: This is a 74 year old female who was identified to have an HGB of 10.1 g/dL and MCV of 66.  She reports a history of anemia in the past, but no formal work up.  She has received B12 supplementation for a low B12 level, but it only seems to improve her symptoms for a short duration.  There is no family history of colon cancer.  No issues with chronic diarrhea, constipation, hematochezia, or melena.  She had a colonoscopy with Dr. Olevia Perches in 1982.  At that time she presented with abdominal pain and hematochezia.  Further evaluation with imaging reported that she had UC, but upon follow up with Dr. Nichola Sizer colonoscopy there was no evidence of UC.  She has not received any treatment for UC since that time.  Past Medical History  Diagnosis Date  . CHF (congestive heart failure)     on Actos w/o complication  . Diabetes mellitus     type 2  . Thyroid disease     hypothyroidism  . Hypertension   . Anemia     B12 deficiency & iron deficiency  . Fibromyalgia   . Bladder cancer     Past Surgical History  Procedure Laterality Date  . Abdominal hysterectomy      for ovarian cyst  . Cataract extraction  03/2004,05/2004  . Cholecystectomy    . Breast surgery    . Breast lumpectomy    . Colonoscopy       X 2; colitis X 1  . Cardiac catheterization      negative  . Bladder surgery      Family History  Problem Relation Age of Onset  . Adopted: Yes    Social History:  reports that she quit smoking about 39 years ago. She does not have any smokeless tobacco history on file. She reports that she does not drink alcohol or use illicit drugs.  Allergies: No Known Allergies  Medications:  Scheduled:  Continuous: . sodium chloride      No results found for this or any previous visit (from the past 24 hour(s)).   No results found.  ROS:  As stated above in the HPI otherwise negative.  Blood pressure 204/93, pulse 84, temperature 97.5 F (36.4 C), temperature source  Oral, resp. rate 17, height 5\' 3"  (1.6 m), weight 95.255 kg (210 lb), SpO2 99 %.    PE: Gen: NAD, Alert and Oriented HEENT:  [place]/AT, EOMI Neck: Supple, no LAD Lungs: CTA Bilaterally CV: RRR without M/G/R ABM: Soft, NTND, +BS Ext: No C/C/E  Assessment/Plan: 1) IDA - EGD/Colonoscopy.  Koki Buxton D 04/21/2014, 9:10 AM

## 2014-04-24 ENCOUNTER — Encounter (HOSPITAL_COMMUNITY): Payer: Self-pay | Admitting: Gastroenterology

## 2014-04-24 ENCOUNTER — Encounter: Payer: Self-pay | Admitting: Internal Medicine

## 2014-04-24 DIAGNOSIS — D126 Benign neoplasm of colon, unspecified: Secondary | ICD-10-CM | POA: Insufficient documentation

## 2014-04-25 ENCOUNTER — Other Ambulatory Visit: Payer: Self-pay | Admitting: Internal Medicine

## 2014-04-26 ENCOUNTER — Encounter (HOSPITAL_COMMUNITY): Payer: Self-pay | Admitting: Gastroenterology

## 2014-06-15 ENCOUNTER — Telehealth: Payer: Self-pay

## 2014-06-15 NOTE — Telephone Encounter (Signed)
Per other, pt has not had a recent mammogram. She will give her the message to advise if ok to order.

## 2014-07-17 ENCOUNTER — Other Ambulatory Visit: Payer: Self-pay | Admitting: Internal Medicine

## 2014-07-17 NOTE — Telephone Encounter (Signed)
bp meds sent to pharm, patient needs office visit before any further refills

## 2014-07-20 ENCOUNTER — Other Ambulatory Visit: Payer: Self-pay | Admitting: Internal Medicine

## 2014-07-20 NOTE — Telephone Encounter (Signed)
Coreg rx sent to pharm

## 2014-07-27 ENCOUNTER — Other Ambulatory Visit: Payer: Self-pay | Admitting: Internal Medicine

## 2014-07-28 ENCOUNTER — Other Ambulatory Visit (INDEPENDENT_AMBULATORY_CARE_PROVIDER_SITE_OTHER): Payer: Medicare Other

## 2014-07-28 ENCOUNTER — Ambulatory Visit (INDEPENDENT_AMBULATORY_CARE_PROVIDER_SITE_OTHER): Payer: Medicare Other | Admitting: Internal Medicine

## 2014-07-28 VITALS — BP 128/60 | HR 81 | Temp 98.3°F | Resp 15 | Ht 63.0 in | Wt 216.4 lb

## 2014-07-28 DIAGNOSIS — E538 Deficiency of other specified B group vitamins: Secondary | ICD-10-CM | POA: Diagnosis not present

## 2014-07-28 DIAGNOSIS — E1165 Type 2 diabetes mellitus with hyperglycemia: Secondary | ICD-10-CM | POA: Diagnosis not present

## 2014-07-28 DIAGNOSIS — IMO0002 Reserved for concepts with insufficient information to code with codable children: Secondary | ICD-10-CM

## 2014-07-28 DIAGNOSIS — I10 Essential (primary) hypertension: Secondary | ICD-10-CM | POA: Diagnosis not present

## 2014-07-28 DIAGNOSIS — J0110 Acute frontal sinusitis, unspecified: Secondary | ICD-10-CM | POA: Diagnosis not present

## 2014-07-28 LAB — BASIC METABOLIC PANEL
BUN: 16 mg/dL (ref 6–23)
CALCIUM: 10.6 mg/dL — AB (ref 8.4–10.5)
CO2: 34 meq/L — AB (ref 19–32)
Chloride: 97 mEq/L (ref 96–112)
Creatinine, Ser: 0.91 mg/dL (ref 0.40–1.20)
GFR: 64.25 mL/min (ref >60.00)
Glucose, Bld: 107 mg/dL — ABNORMAL HIGH (ref 70–99)
POTASSIUM: 3.9 meq/L (ref 3.5–5.1)
Sodium: 136 mEq/L (ref 135–145)

## 2014-07-28 LAB — MICROALBUMIN / CREATININE URINE RATIO
CREATININE, U: 86.4 mg/dL
MICROALB/CREAT RATIO: 1.2 mg/g (ref 0.0–30.0)
Microalb, Ur: 1 mg/dL (ref 0.0–1.9)

## 2014-07-28 LAB — HEMOGLOBIN A1C: Hgb A1c MFr Bld: 7.8 % — ABNORMAL HIGH (ref 4.6–6.5)

## 2014-07-28 MED ORDER — AMOXICILLIN 500 MG PO CAPS: 500.0000 mg | ORAL_CAPSULE | Freq: Three times a day (TID) | ORAL | Status: DC

## 2014-07-28 NOTE — Progress Notes (Signed)
   Subjective:    Patient ID: Sierra Fisher, female    DOB: 05-29-1940, 74 y.o.   MRN: [id number]  HPI  The patient is here to assess status of active health conditions.  Compliance with diabetic medicines is unclear. She did not follow-up with Dr. Dwyane Dee as recommended. She states "he didn't do anything and charged me $45". His last note was reviewed; she had not been taking prescribed short acting insulin due to cost. Remotely she did see Dr.Balan, ,Endocrinologist but  was not pleased with her service either. She cannot remember why she was not satisfied.I had referred to Endocrinology both times because of non adherent behavior as major factor in her very poorly controlled diabetes.  She had been walking 2-3 times every week for 15 minutes but has discontinued this in the last week because of a respiratory tract infection.  She states that she is on "half salt" at the table. New. She denies any active cardiopulmonary symptoms except for some edema.  She is not monitoring her blood pressure.  Last Friday she began to have scratchy throat and rhinitis. As of 6/19 she had a cough with milky, thick, and intermittently green sputum. She had associated frontal headache. She also had some nasal secretions which were green and cloudy. She describes extrinsic symptoms of watery eyes and sneezing. With the cough she's had mild dyspnea and occasional wheezing.  B12 deficiency is listed as dx ; but no B12 on med list.B12 was 171 in 12/15.  Review of Systems Chest pain, palpitations, tachycardia, exertional dyspnea, paroxysmal nocturnal dyspnea, claudication or edema are absent..       Objective:   Physical Exam Pertinent or positive findings include: BMI: 38.34.There is erythema of the nasal mucosa. She has an intermittent raspy cough. S4 gallop is noted. Her abdomen is protuberant with central obesity.  General appearance :adequately nourished; in no distress.  Eyes: No conjunctival inflammation  or scleral icterus is present.  Oral exam:  Lips and gums are healthy appearing.There is no oropharyngeal erythema or exudate noted. Dental hygiene is good.  Heart:  Normal rate and regular rhythm. S1 and S2 normal without murmur, click, rub or other extra sounds    Lungs:Chest clear to auscultation; no wheezes, rhonchi,rales ,or rubs present.No increased work of breathing.   Abdomen: bowel sounds normal, soft and non-tender without masses, organomegaly or hernias noted.  No guarding or rebound.   Vascular : all pulses equal ; no bruits present.  Skin:Warm & dry.  Intact without suspicious lesions or rashes ; no tenting or jaundice   Lymphatic: No lymphadenopathy is noted about the head, neck, axilla  Neuro: Strength, tone & DTRs normal.         Assessment & Plan:  #1 hypertension, unknown level of control due to lack of monitor. Today's blood pressure was good  #2 diabetes; noncompliance with Dr Ronnie Derby recommendations &  follow-up. Status unknown  #3 history of B12 deficiency. Methylmalonic acid level will be drawn to assess her present status.  #4 acute frontal sinusitis.  Plan: See orders and recommendations

## 2014-07-28 NOTE — Patient Instructions (Addendum)
  Your next office appointment will be determined based upon review of your pending labs . Those written interpretation of the lab results and instructions will be transmitted to you by mail for your records. Critical results will be called. Followup as needed for any active or acute issue. Please report any significant change in your symptoms.   Plain Mucinex (NOT D) for thick secretions ;force NON dairy fluids .   Nasal cleansing in the shower as discussed with lather of mild shampoo.After 10 seconds wash off lather while  exhaling through nostrils. Make sure that all residual soap is removed to prevent irritation.  Flonase OR Nasacort AQ 1 spray in each nostril twice a day as needed. Use the "crossover" technique into opposite nostril spraying toward opposite ear @ 45 degree angle, not straight up into nostril.  Plain Allegra (NOT D )  160 daily , Loratidine 10 mg , OR Zyrtec 10 mg @ bedtime  as needed for itchy eyes & sneezing.  Minimal Blood Pressure Goal= AVERAGE < 140/90;  Ideal is an AVERAGE < 135/85. This AVERAGE should be calculated from @ least 3 BP readings taken @ different times of day on different days of week. You should not respond to isolated BP readings , but rather the AVERAGE for that week .Finger or wrist cuffs are not dependable; an arm cuff is.

## 2014-07-28 NOTE — Progress Notes (Signed)
Pre visit review using our clinic review tool, if applicable. No additional management support is needed unless otherwise documented below in the visit note. 

## 2014-07-29 ENCOUNTER — Encounter: Payer: Self-pay | Admitting: Internal Medicine

## 2014-08-01 LAB — METHYLMALONIC ACID, SERUM: METHYLMALONIC ACID, QUANT: 544 nmol/L — AB (ref 87–318)

## 2014-08-02 ENCOUNTER — Other Ambulatory Visit: Payer: Self-pay | Admitting: Internal Medicine

## 2014-08-28 ENCOUNTER — Other Ambulatory Visit: Payer: Self-pay | Admitting: Internal Medicine

## 2014-10-12 ENCOUNTER — Other Ambulatory Visit: Payer: Self-pay | Admitting: Internal Medicine

## 2014-10-17 ENCOUNTER — Telehealth: Payer: Self-pay | Admitting: *Deleted

## 2014-10-17 DIAGNOSIS — E038 Other specified hypothyroidism: Secondary | ICD-10-CM

## 2014-10-17 MED ORDER — HYDRALAZINE HCL 50 MG PO TABS: 50.0000 mg | ORAL_TABLET | Freq: Three times a day (TID) | ORAL | Status: DC

## 2014-10-17 MED ORDER — PRAVASTATIN SODIUM 40 MG PO TABS: 40.0000 mg | ORAL_TABLET | Freq: Every day | ORAL | Status: DC

## 2014-10-17 MED ORDER — LEVOTHYROXINE SODIUM 125 MCG PO TABS: ORAL_TABLET | ORAL | Status: DC

## 2014-10-17 MED ORDER — CHLORTHALIDONE 25 MG PO TABS: 12.5000 mg | ORAL_TABLET | Freq: Every day | ORAL | Status: DC

## 2014-10-17 NOTE — Telephone Encounter (Signed)
Pt states she has been trying to get her medication fill for a week now went to [facility] they stated they have fax office but haven't receive any response. Inform pt per chart only request came from [facility] on 10/12/14 was the generic coreg. Pt states she is needing her thyroid med, pravastatin, hydralazine, and the hygroton. Inform pt sending refills electronically as we speak...Johny Chess

## 2014-11-10 ENCOUNTER — Other Ambulatory Visit: Payer: Self-pay | Admitting: Internal Medicine

## 2014-12-05 ENCOUNTER — Telehealth: Payer: Self-pay | Admitting: *Deleted

## 2014-12-05 MED ORDER — METFORMIN HCL ER 500 MG PO TB24: 500.0000 mg | ORAL_TABLET | Freq: Two times a day (BID) | ORAL | Status: DC

## 2014-12-05 NOTE — Telephone Encounter (Signed)
Patient no longer under my care

## 2014-12-05 NOTE — Telephone Encounter (Signed)
OK X 3 mos 

## 2014-12-05 NOTE — Telephone Encounter (Signed)
Notified pt md ok refill has been sent to [facility]...Sierra Fisher

## 2014-12-05 NOTE — Telephone Encounter (Signed)
Left msg on triage requesting refill on her Metformin. Pt see Dr. Dwyane Dee for her diabetes forwarding msg to his assisant./lmb

## 2014-12-05 NOTE — Telephone Encounter (Signed)
Dr. Linna Darner is it ok to refill pt Metformin. She saw you on 6/24 for CPX...Johny Chess

## 2015-01-04 ENCOUNTER — Telehealth: Payer: Self-pay | Admitting: Internal Medicine

## 2015-01-04 MED ORDER — INSULIN REGULAR HUMAN 100 UNIT/ML IJ SOLN: 30.0000 [IU] | Freq: Two times a day (BID) | INTRAMUSCULAR | Status: DC

## 2015-01-04 MED ORDER — INSULIN NPH (HUMAN) (ISOPHANE) 100 UNIT/ML ~~LOC~~ SUSP: SUBCUTANEOUS | Status: DC

## 2015-01-04 NOTE — Telephone Encounter (Signed)
Pt requesting refill for insulin NPH Human (NOVOLIN N) 100 UNIT/ML injection [id number] and insulin regular (NOVOLIN R,HUMULIN R) 100 units/mL injection [id number]  Pharmacy is [facility]

## 2015-02-01 ENCOUNTER — Ambulatory Visit: Payer: Medicare Other | Admitting: Internal Medicine

## 2015-02-01 ENCOUNTER — Other Ambulatory Visit: Payer: Self-pay | Admitting: Emergency Medicine

## 2015-02-01 MED ORDER — INSULIN NPH (HUMAN) (ISOPHANE) 100 UNIT/ML ~~LOC~~ SUSP
SUBCUTANEOUS | Status: DC
Start: 2015-02-01 — End: 2015-07-06

## 2015-03-08 ENCOUNTER — Encounter: Payer: Self-pay | Admitting: Internal Medicine

## 2015-03-08 ENCOUNTER — Other Ambulatory Visit (INDEPENDENT_AMBULATORY_CARE_PROVIDER_SITE_OTHER): Payer: Medicare Other

## 2015-03-08 ENCOUNTER — Ambulatory Visit (INDEPENDENT_AMBULATORY_CARE_PROVIDER_SITE_OTHER): Payer: Medicare Other | Admitting: Internal Medicine

## 2015-03-08 VITALS — BP 140/86 | HR 86 | Temp 98.6°F | Resp 20 | Wt 222.0 lb

## 2015-03-08 DIAGNOSIS — R1032 Left lower quadrant pain: Secondary | ICD-10-CM | POA: Insufficient documentation

## 2015-03-08 DIAGNOSIS — I10 Essential (primary) hypertension: Secondary | ICD-10-CM

## 2015-03-08 DIAGNOSIS — R300 Dysuria: Secondary | ICD-10-CM

## 2015-03-08 DIAGNOSIS — IMO0001 Reserved for inherently not codable concepts without codable children: Secondary | ICD-10-CM

## 2015-03-08 DIAGNOSIS — E1165 Type 2 diabetes mellitus with hyperglycemia: Secondary | ICD-10-CM

## 2015-03-08 DIAGNOSIS — Z794 Long term (current) use of insulin: Secondary | ICD-10-CM

## 2015-03-08 LAB — CBC WITH DIFFERENTIAL/PLATELET
BASOS ABS: 0.1 10*3/uL (ref 0.0–0.1)
Basophils Relative: 0.5 % (ref 0.0–3.0)
EOS ABS: 0.3 10*3/uL (ref 0.0–0.7)
Eosinophils Relative: 2.5 % (ref 0.0–5.0)
HCT: 41.6 % (ref 36.0–46.0)
HEMOGLOBIN: 13.9 g/dL (ref 12.0–15.0)
Lymphocytes Relative: 21.8 % (ref 12.0–46.0)
Lymphs Abs: 2.6 10*3/uL (ref 0.7–4.0)
MCHC: 33.4 g/dL (ref 30.0–36.0)
MCV: 84.6 fl (ref 78.0–100.0)
MONO ABS: 1.3 10*3/uL — AB (ref 0.1–1.0)
Monocytes Relative: 10.3 % (ref 3.0–12.0)
Neutro Abs: 7.9 10*3/uL — ABNORMAL HIGH (ref 1.4–7.7)
Neutrophils Relative %: 64.9 % (ref 43.0–77.0)
Platelets: 317 10*3/uL (ref 150.0–400.0)
RBC: 4.92 Mil/uL (ref 3.87–5.11)
RDW: 13.3 % (ref 11.5–15.5)
WBC: 12.1 10*3/uL — AB (ref 4.0–10.5)

## 2015-03-08 LAB — URINALYSIS, ROUTINE W REFLEX MICROSCOPIC
BILIRUBIN URINE: NEGATIVE
Hgb urine dipstick: NEGATIVE
KETONES UR: NEGATIVE
NITRITE: NEGATIVE
PH: 5.5 (ref 5.0–8.0)
Specific Gravity, Urine: 1.025 (ref 1.000–1.030)
TOTAL PROTEIN, URINE-UPE24: NEGATIVE
Urine Glucose: NEGATIVE
Urobilinogen, UA: 0.2 (ref 0.0–1.0)

## 2015-03-08 LAB — POCT URINALYSIS DIPSTICK
Bilirubin, UA: NEGATIVE
Glucose, UA: NEGATIVE
Ketones, UA: NEGATIVE
Leukocytes, UA: NEGATIVE
NITRITE UA: NEGATIVE
PH UA: 6
PROTEIN UA: NEGATIVE
RBC UA: NEGATIVE
SPEC GRAV UA: 1.025
UROBILINOGEN UA: NEGATIVE

## 2015-03-08 MED ORDER — METRONIDAZOLE 500 MG PO TABS: 500.0000 mg | ORAL_TABLET | Freq: Three times a day (TID) | ORAL | Status: DC

## 2015-03-08 MED ORDER — CIPROFLOXACIN HCL 500 MG PO TABS: 500.0000 mg | ORAL_TABLET | Freq: Two times a day (BID) | ORAL | Status: DC

## 2015-03-08 NOTE — Progress Notes (Signed)
Subjective:    Patient ID: Molli Knock, female    DOB: 20-Apr-1940, 75 y.o.   MRN: [id number]  HPI  Here with 2-4 days with lower left abd pain, lots of gas and belching, but also possible urinary freq with very mild dyrusia but Denies urinary symptoms such as flank pain, hematuria or n/v, fever, chills though has felt hot and cold.   Had some diarrhea/loose stools this am but no blood and no loose stools this am,  Last UTI x 5 yrs. Recent colonoscopy with diverticulosis, no hx of diverticulitis.   Pt denies chest pain, increased sob or doe, wheezing, orthopnea, PND, increased LE swelling, palpitations, dizziness or syncope.  Pt denies new neurological symptoms such as new headache, or facial or extremity weakness or numbness   Pt denies polydipsia, polyuria.  No new back pain but Pt continues to have recurring LBP without change in severity, bowel or bladder change, fever, wt loss,  worsening LE pain/numbness/weakness, gait change or falls except has recurring pain to the upper legs occasoinally Past Medical History  Diagnosis Date  . CHF (congestive heart failure) (HCC)     on Actos w/o complication  . Diabetes mellitus     type 2  . Thyroid disease     hypothyroidism  . Hypertension   . Anemia     B12 deficiency & iron deficiency  . Fibromyalgia   . Bladder cancer [facility])    Past Surgical History  Procedure Laterality Date  . Abdominal hysterectomy      for ovarian cyst  . Cataract extraction  03/2004,05/2004  . Cholecystectomy    . Breast surgery    . Breast lumpectomy    . Colonoscopy       X 2; colitis X 1  . Cardiac catheterization      negative  . Bladder surgery    . Esophagogastroduodenoscopy (egd) with propofol N/A 04/21/2014    Procedure: ESOPHAGOGASTRODUODENOSCOPY (EGD) WITH PROPOFOL;  Surgeon: Carol Ada, MD;  Location: [facility];  Service: Endoscopy;  Laterality: N/A;  . Colonoscopy with propofol N/A 04/21/2014    Procedure: COLONOSCOPY WITH PROPOFOL;  Surgeon:  Carol Ada, MD;  Location: [facility] END[facility];  Service: Endoscopy;  Laterality: N/A;    reports that she quit smoking about 40 years ago. She does not have any smokeless tobacco history on file. She reports that she does not drink alcohol or use illicit drugs. family history is not on file. She was adopted. No Known Allergies Current Outpatient Prescriptions on File Prior to Visit  Medication Sig Dispense Refill  . acetaminophen (TYLENOL) 500 MG tablet Take 500 mg by mouth every 6 (six) hours as needed (Pain).    Marland Kitchen amoxicillin (AMOXIL) 500 MG capsule Take 1 capsule (500 mg total) by mouth 3 (three) times daily. 30 capsule 0  . aspirin 81 MG EC tablet Take 81 mg by mouth daily.      . carvedilol (COREG) 25 MG tablet TAKE ONE TABLET BY MOUTH TWICE DAILY 60 tablet 0  . carvedilol (COREG) 25 MG tablet Take 1 tablet by mouth twice daily. 180 tablet 3  . chlorthalidone (HYGROTON) 25 MG tablet Take 0.5 tablets (12.5 mg total) by mouth daily. 45 tablet 3  . cholecalciferol (VITAMIN D) 1000 UNITS tablet Take 1,000 Units by mouth daily.    . Cholecalciferol (VITAMIN D3) 400 UNITS CAPS Take by mouth daily.    . cyanocobalamin (,VITAMIN B-12,) 1000 MCG/ML injection 1 injection monthly (Patient taking differently: Inject 1,000  mcg into the muscle every 30 (thirty) days. 1 injection monthly) 10 mL 1  . diphenhydrAMINE (BENADRYL) 25 mg capsule Take 25 mg by mouth every 6 (six) hours as needed for allergies.    Marland Kitchen docusate sodium (COLACE) 100 MG capsule Take 100 mg by mouth daily as needed for mild constipation.    . furosemide (LASIX) 40 MG tablet TAKE ONE TABLET BY MOUTH ONCE DAILY 30 tablet 5  . glucose blood (FREESTYLE INSULINX TEST) test strip Use as instructed to check sugar 2 times per day dx code E11.65 100 each 5  . glucose monitoring kit (FREESTYLE) monitoring kit 1 each by Does not apply route as needed.      . hydrALAZINE (APRESOLINE) 50 MG tablet Take 1 tablet (50 mg total) by mouth 3 (three) times  daily. 270 tablet 3  . insulin NPH Human (NOVOLIN N) 100 UNIT/ML injection Inject 25 units in am and pm ---- Please establish with new PCP for further refills 30 mL 0  . insulin regular (NOVOLIN R,HUMULIN R) 100 units/mL injection Inject 0.3 mLs (30 Units total) into the skin 2 (two) times daily. 10 mL 1  . KLOR-CON M20 20 MEQ tablet TAKE ONE TABLET BY MOUTH ONCE DAILY 90 tablet 1  . KRILL OIL PO Take 4 capsules by mouth daily.    Marland Kitchen levothyroxine (SYNTHROID, LEVOTHROID) 125 MCG tablet TAKE ONE TABLET BY MOUTH EVERY DAY,EXCEPT TAKE ONE AND ONE-HALF TABLET ON TUESDAYS,THURSDAYS AND SATURDAYS 180 tablet 3  . loperamide (IMODIUM) 2 MG capsule Take 2 mg by mouth 3 (three) times daily as needed for diarrhea or loose stools.    . metFORMIN (GLUCOPHAGE-XR) 500 MG 24 hr tablet Take 1-2 tablets (500-1,000 mg total) by mouth 2 (two) times daily. 2 in the morning and 1 in the evening. 90 tablet 3  . Multiple Vitamin (MULTIVITAMIN WITH MINERALS) TABS tablet Take 1 tablet by mouth daily.    . potassium chloride SA (KLOR-CON M20) 20 MEQ tablet Take 1 tablet (20 mEq total) by mouth daily. 90 tablet 1  . pravastatin (PRAVACHOL) 40 MG tablet Take 1 tablet (40 mg total) by mouth daily. 90 tablet 3  . Probiotic Product (PHILLIPS COLON HEALTH PO) Take 1 tablet by mouth daily.    . ramipril (ALTACE) 5 MG capsule TAKE ONE CAPSULE BY MOUTH DAILY FOR HYPERTENSION AND TO PREVENT DIABETIC KIDNEY DISEASE 90 capsule 1  . simethicone (MYLICON) 80 MG chewable tablet Chew 80 mg by mouth every 6 (six) hours as needed for flatulence.    Marland Kitchen tetrahydrozoline 0.05 % ophthalmic solution Place 1 drop into both eyes 2 (two) times daily as needed (Eye allergies).    . vitamin E 400 UNIT capsule Take 400 Units by mouth daily.     No current facility-administered medications on file prior to visit.   Review of Systems  Constitutional: Negative for unusual diaphoresis or night sweats HENT: Negative for ringing in ear or discharge Eyes:  Negative for double vision or worsening visual disturbance.  Respiratory: Negative for choking and stridor.   Gastrointestinal: Negative for vomiting or other signifcant bowel change Genitourinary: Negative for hematuria or change in urine volume.  Musculoskeletal: Negative for other MSK pain or swelling Skin: Negative for color change and worsening wound.  Neurological: Negative for tremors and numbness other than noted  Psychiatric/Behavioral: Negative for decreased concentration or agitation other than above       Objective:   Physical Exam BP 140/86 mmHg  Pulse 86  Temp(Src) 98.6  F (37 C) (Oral)  Resp 20  Wt 222 lb (100.699 kg)  SpO2 92% VS noted,  Constitutional: Pt appears in no significant distress HENT: Head: NCAT.  Right Ear: External ear normal.  Left Ear: External ear normal.  Eyes: . Pupils are equal, round, and reactive to light. Conjunctivae and EOM are normal Neck: Normal range of motion. Neck supple.  Cardiovascular: Normal rate and regular rhythm.   Pulmonary/Chest: Effort normal and breath sounds without rales or wheezing.  Abd:  Soft, NT, ND, + BS except for mild LLQ tender without guarding or rebound Neurological: Pt is alert. Not confused , motor grossly intact Skin: Skin is warm. No rash, no LE edema Psychiatric: Pt behavior is normal. No agitation.   POCT Urinalysis Dipstick  Status: Finalresult Visible to patient:  Not Released Dx:  Dysuria   Normal           Ref Range 1d ago  36yrago     Color, UA  yellow     Clarity, UA  cloudy     Glucose, UA  negative     Bilirubin, UA  negative     Ketones, UA  negative     Spec Grav, UA  1.025     Blood, UA  negative     pH, UA  6.0     Protein, UA  negative     Urobilinogen, UA  negative 0.2    Nitrite, UA  negative     Leukocytes, UA Negative  Negative             Assessment & Plan:

## 2015-03-08 NOTE — Patient Instructions (Addendum)
You had the antibiotic shot today (rocephin)  Please take all new medication as prescribed - the cipro and flagyl antibiotics  Please continue all other medications as before, and refills have been done if requested.  Please have the pharmacy call with any other refills you may need.  Please keep your appointments with your specialists as you may have planned  Please go to the LAB in the Basement (turn left off the elevator) for the tests to be done today (including a new try at urine sampling as the amount in the office was not enough)  You will be contacted by phone if any changes need to be made immediately.  Otherwise, you will receive a letter about your results with an explanation, but please check with MyChart first.  Please remember to sign up for MyChart if you have not done so, as this will be important to you in the future with finding out test results, communicating by private email, and scheduling acute appointments online when needed.  Please return if pain, fever, or other unusual symptoms occur, to consider a CT scan

## 2015-03-08 NOTE — Progress Notes (Signed)
Pre visit review using our clinic review tool, if applicable. No additional management support is needed unless otherwise documented below in the visit note. 

## 2015-03-08 NOTE — Assessment & Plan Note (Signed)
Pt is eager for dx of UTI, but evidence is underwhelming; I suspect she actually has mild early acute diverticulitis; for cbc/bmp and urine cx, rocephin IM today, and cipro/flagyl for home, will hold on CT imaging for now, but will need to consider if worsens

## 2015-03-09 ENCOUNTER — Encounter: Payer: Self-pay | Admitting: Internal Medicine

## 2015-03-09 LAB — LIPASE: Lipase: 28 U/L (ref 11.0–59.0)

## 2015-03-09 LAB — BASIC METABOLIC PANEL
BUN: 17 mg/dL (ref 6–23)
CHLORIDE: 97 meq/L (ref 96–112)
CO2: 25 mEq/L (ref 19–32)
Calcium: 10.2 mg/dL (ref 8.4–10.5)
Creatinine, Ser: 0.89 mg/dL (ref 0.40–1.20)
GFR: 65.81 mL/min (ref >60.00)
GLUCOSE: 201 mg/dL — AB (ref 70–99)
POTASSIUM: 4.1 meq/L (ref 3.5–5.1)
SODIUM: 133 meq/L — AB (ref 135–145)

## 2015-03-10 NOTE — Assessment & Plan Note (Signed)
stable overall by history and exam, recent data reviewed with pt, and pt to continue medical treatment as before,  to f/u any worsening symptoms or concerns Lab Results  Component Value Date   HGBA1C 7.8* 07/28/2014   Pt to call for worsening onset polys or cbg > 200 with illness

## 2015-03-10 NOTE — Assessment & Plan Note (Signed)
stable overall by history and exam, recent data reviewed with pt, and pt to continue medical treatment as before,  to f/u any worsening symptoms or concerns BP Readings from Last 3 Encounters:  03/08/15 140/86  07/28/14 128/60  04/21/14 205/89

## 2015-03-11 LAB — CULTURE, URINE COMPREHENSIVE

## 2015-03-23 ENCOUNTER — Other Ambulatory Visit: Payer: Self-pay | Admitting: Internal Medicine

## 2015-03-23 NOTE — Telephone Encounter (Signed)
Okay to refill under your name? You saw pt on 03/13/15. Please advise

## 2015-03-23 NOTE — Telephone Encounter (Signed)
Done erx 

## 2015-04-30 ENCOUNTER — Other Ambulatory Visit: Payer: Self-pay | Admitting: *Deleted

## 2015-04-30 MED ORDER — RAMIPRIL 5 MG PO CAPS: ORAL_CAPSULE | ORAL | Status: DC

## 2015-06-12 ENCOUNTER — Telehealth: Payer: Self-pay

## 2015-06-12 NOTE — Telephone Encounter (Signed)
Last AWV for pt was 01/2014.   Can call patient if you would like me to?

## 2015-06-14 NOTE — Telephone Encounter (Signed)
Noted to fup in Mid June;

## 2015-06-14 NOTE — Telephone Encounter (Signed)
Call to fup with Ms. Sierra Fisher regarding AWV; States she is very busy currently and will not have time until the middle of next month. Will plan to outreach mid June

## 2015-07-06 ENCOUNTER — Other Ambulatory Visit (INDEPENDENT_AMBULATORY_CARE_PROVIDER_SITE_OTHER): Payer: Medicare Other

## 2015-07-06 ENCOUNTER — Other Ambulatory Visit: Payer: Self-pay | Admitting: Internal Medicine

## 2015-07-06 ENCOUNTER — Encounter: Payer: Self-pay | Admitting: Internal Medicine

## 2015-07-06 ENCOUNTER — Ambulatory Visit (INDEPENDENT_AMBULATORY_CARE_PROVIDER_SITE_OTHER): Payer: Medicare Other | Admitting: Internal Medicine

## 2015-07-06 VITALS — BP 130/72 | HR 91 | Temp 98.7°F | Resp 20 | Wt 213.0 lb

## 2015-07-06 DIAGNOSIS — E119 Type 2 diabetes mellitus without complications: Secondary | ICD-10-CM

## 2015-07-06 DIAGNOSIS — E039 Hypothyroidism, unspecified: Secondary | ICD-10-CM

## 2015-07-06 DIAGNOSIS — Z0001 Encounter for general adult medical examination with abnormal findings: Secondary | ICD-10-CM | POA: Diagnosis not present

## 2015-07-06 DIAGNOSIS — Z23 Encounter for immunization: Secondary | ICD-10-CM

## 2015-07-06 DIAGNOSIS — IMO0001 Reserved for inherently not codable concepts without codable children: Secondary | ICD-10-CM

## 2015-07-06 DIAGNOSIS — E782 Mixed hyperlipidemia: Secondary | ICD-10-CM | POA: Diagnosis not present

## 2015-07-06 DIAGNOSIS — Z794 Long term (current) use of insulin: Secondary | ICD-10-CM

## 2015-07-06 DIAGNOSIS — R6889 Other general symptoms and signs: Secondary | ICD-10-CM | POA: Diagnosis not present

## 2015-07-06 DIAGNOSIS — Z0000 Encounter for general adult medical examination without abnormal findings: Secondary | ICD-10-CM | POA: Insufficient documentation

## 2015-07-06 DIAGNOSIS — E1165 Type 2 diabetes mellitus with hyperglycemia: Secondary | ICD-10-CM

## 2015-07-06 DIAGNOSIS — M5416 Radiculopathy, lumbar region: Secondary | ICD-10-CM | POA: Diagnosis not present

## 2015-07-06 DIAGNOSIS — I10 Essential (primary) hypertension: Secondary | ICD-10-CM

## 2015-07-06 LAB — BASIC METABOLIC PANEL
BUN: 15 mg/dL (ref 6–23)
CALCIUM: 10.3 mg/dL (ref 8.4–10.5)
CO2: 27 meq/L (ref 19–32)
CREATININE: 0.81 mg/dL (ref 0.40–1.20)
Chloride: 100 mEq/L (ref 96–112)
GFR: 73.3 mL/min (ref >60.00)
GLUCOSE: 211 mg/dL — AB (ref 70–99)
Potassium: 3.9 mEq/L (ref 3.5–5.1)
SODIUM: 136 meq/L (ref 135–145)

## 2015-07-06 LAB — URINALYSIS, ROUTINE W REFLEX MICROSCOPIC
BILIRUBIN URINE: NEGATIVE
Hgb urine dipstick: NEGATIVE
KETONES UR: NEGATIVE
NITRITE: NEGATIVE
PH: 6 (ref 5.0–8.0)
Specific Gravity, Urine: 1.02 (ref 1.000–1.030)
TOTAL PROTEIN, URINE-UPE24: NEGATIVE
UROBILINOGEN UA: 0.2 (ref 0.0–1.0)
Urine Glucose: NEGATIVE

## 2015-07-06 LAB — CBC WITH DIFFERENTIAL/PLATELET
BASOS ABS: 0.1 10*3/uL (ref 0.0–0.1)
Basophils Relative: 0.7 % (ref 0.0–3.0)
Eosinophils Absolute: 0.2 10*3/uL (ref 0.0–0.7)
Eosinophils Relative: 1.8 % (ref 0.0–5.0)
HCT: 42.1 % (ref 36.0–46.0)
HEMOGLOBIN: 14.3 g/dL (ref 12.0–15.0)
LYMPHS ABS: 2.5 10*3/uL (ref 0.7–4.0)
Lymphocytes Relative: 20.8 % (ref 12.0–46.0)
MCHC: 34 g/dL (ref 30.0–36.0)
MCV: 84.3 fl (ref 78.0–100.0)
MONOS PCT: 7.1 % (ref 3.0–12.0)
Monocytes Absolute: 0.8 10*3/uL (ref 0.1–1.0)
NEUTROS PCT: 69.6 % (ref 43.0–77.0)
Neutro Abs: 8.2 10*3/uL — ABNORMAL HIGH (ref 1.4–7.7)
Platelets: 308 10*3/uL (ref 150.0–400.0)
RBC: 4.99 Mil/uL (ref 3.87–5.11)
RDW: 12.7 % (ref 11.5–15.5)
WBC: 11.8 10*3/uL — AB (ref 4.0–10.5)

## 2015-07-06 LAB — MICROALBUMIN / CREATININE URINE RATIO
CREATININE, U: 138.4 mg/dL
MICROALB UR: 2.7 mg/dL — AB (ref 0.0–1.9)
MICROALB/CREAT RATIO: 2 mg/g (ref 0.0–30.0)

## 2015-07-06 LAB — LIPID PANEL
CHOL/HDL RATIO: 4
Cholesterol: 131 mg/dL (ref 0–200)
HDL: 36.1 mg/dL — ABNORMAL LOW (ref >39.00)
LDL Cholesterol: 61 mg/dL (ref 0–99)
NONHDL: 94.66
Triglycerides: 170 mg/dL — ABNORMAL HIGH (ref 0.0–149.0)
VLDL: 34 mg/dL (ref 0.0–40.0)

## 2015-07-06 LAB — HEMOGLOBIN A1C: Hgb A1c MFr Bld: 9.5 % — ABNORMAL HIGH (ref 4.6–6.5)

## 2015-07-06 LAB — HEPATIC FUNCTION PANEL
ALBUMIN: 4.3 g/dL (ref 3.5–5.2)
ALK PHOS: 67 U/L (ref 39–117)
ALT: 30 U/L (ref 0–35)
AST: 25 U/L (ref 0–37)
Bilirubin, Direct: 0.2 mg/dL (ref 0.0–0.3)
TOTAL PROTEIN: 7.5 g/dL (ref 6.0–8.3)
Total Bilirubin: 0.5 mg/dL (ref 0.2–1.2)

## 2015-07-06 LAB — TSH: TSH: 5.92 u[IU]/mL — AB (ref 0.35–4.50)

## 2015-07-06 MED ORDER — PREDNISONE 10 MG PO TABS: ORAL_TABLET | ORAL | Status: DC

## 2015-07-06 MED ORDER — TRAMADOL HCL 50 MG PO TABS: 50.0000 mg | ORAL_TABLET | Freq: Three times a day (TID) | ORAL | Status: DC | PRN

## 2015-07-06 MED ORDER — INSULIN REGULAR HUMAN 100 UNIT/ML IJ SOLN: 30.0000 [IU] | Freq: Two times a day (BID) | INTRAMUSCULAR | Status: DC

## 2015-07-06 MED ORDER — INSULIN NPH (HUMAN) (ISOPHANE) 100 UNIT/ML ~~LOC~~ SUSP: SUBCUTANEOUS | Status: DC

## 2015-07-06 NOTE — Progress Notes (Signed)
Subjective:    Patient ID: Sierra Fisher, female    DOB: 08/17/1940, 75 y.o.   MRN: [id number]  HPI Here for wellness and f/u;  Overall doing ok;  Pt denies Chest pain, worsening SOB, DOE, wheezing, orthopnea, PND, worsening LE edema, palpitations, dizziness or syncope.  Pt denies neurological change such as new headache, facial or extremity weakness.  Pt denies polydipsia, polyuria, or low sugar symptoms. Pt states overall good compliance with treatment and medications, good tolerability, and has been trying to follow appropriate diet.  Pt denies worsening depressive symptoms, suicidal ideation or panic. No fever, night sweats, wt loss, loss of appetite, or other constitutional symptoms.  Pt states good ability with ADL's, has low fall risk, home safety reviewed and adequate, no other significant changes in hearing or vision, and only occasionally active with exercise. Due for Prevnar  Also here with 3 wks onset intermittent now constant pain to left buttock and LLE to ankle, severe now, shooting type of pain, assoc with some weakness and numbness, worse with standing and walking but present with sitting and lying, tylenol and "oxyrub" otc has helped as well.Worse to stand and walking.   No prior signifcant neuritic or LE hx, no recent xray, MRI, surgury evaluations, PT.   Had Similar episode 2 yrs ago much less severe but had to go to ER for a shot, has had 4-5 times much mild short episodes,  Does also have a swelling area to the post left knee with some discomfort but has present for several months and does not hurt at the same time. Otherwise Pt denies chest pain, increasing sob or doe, wheezing, orthopnea, PND, increased LE swelling, palpitations, dizziness or syncope.  Pt denies new neurological symptoms such as new headache, or facial or extremity weakness or numbness.  Pt denies polydipsia, polyuria, or low sugar episode.   Pt denies new neurological symptoms such as new headache, or facial or  extremity weakness or numbness.   Pt states overall good compliance with meds, mostly trying to follow appropriate diet, with wt overall stable,  but little exercise however.  Past Medical History  Diagnosis Date  . CHF (congestive heart failure) (HCC)     on Actos w/o complication  . Diabetes mellitus     type 2  . Thyroid disease     hypothyroidism  . Hypertension   . Anemia     B12 deficiency & iron deficiency  . Fibromyalgia   . Bladder cancer [facility])    Past Surgical History  Procedure Laterality Date  . Abdominal hysterectomy      for ovarian cyst  . Cataract extraction  03/2004,05/2004  . Cholecystectomy    . Breast surgery    . Breast lumpectomy    . Colonoscopy       X 2; colitis X 1  . Cardiac catheterization      negative  . Bladder surgery    . Esophagogastroduodenoscopy (egd) with propofol N/A 04/21/2014    Procedure: ESOPHAGOGASTRODUODENOSCOPY (EGD) WITH PROPOFOL;  Surgeon: Carol Ada, MD;  Location: [facility];  Service: Endoscopy;  Laterality: N/A;  . Colonoscopy with propofol N/A 04/21/2014    Procedure: COLONOSCOPY WITH PROPOFOL;  Surgeon: Carol Ada, MD;  Location: [facility];  Service: Endoscopy;  Laterality: N/A;    reports that she quit smoking about 40 years ago. She does not have any smokeless tobacco history on file. She reports that she does not drink alcohol or use illicit drugs. family history is  not on file. She was adopted. No Known Allergies Current Outpatient Prescriptions on File Prior to Visit  Medication Sig Dispense Refill  . acetaminophen (TYLENOL) 500 MG tablet Take 500 mg by mouth every 6 (six) hours as needed (Pain).    Marland Kitchen amoxicillin (AMOXIL) 500 MG capsule Take 1 capsule (500 mg total) by mouth 3 (three) times daily. 30 capsule 0  . aspirin 81 MG EC tablet Take 81 mg by mouth daily.      . carvedilol (COREG) 25 MG tablet TAKE ONE TABLET BY MOUTH TWICE DAILY 60 tablet 0  . carvedilol (COREG) 25 MG tablet Take 1 tablet by mouth twice  daily. 180 tablet 3  . chlorthalidone (HYGROTON) 25 MG tablet Take 0.5 tablets (12.5 mg total) by mouth daily. 45 tablet 3  . cholecalciferol (VITAMIN D) 1000 UNITS tablet Take 1,000 Units by mouth daily.    . Cholecalciferol (VITAMIN D3) 400 UNITS CAPS Take by mouth daily.    . ciprofloxacin (CIPRO) 500 MG tablet Take 1 tablet (500 mg total) by mouth 2 (two) times daily. 20 tablet 0  . cyanocobalamin (,VITAMIN B-12,) 1000 MCG/ML injection 1 injection monthly (Patient taking differently: Inject 1,000 mcg into the muscle every 30 (thirty) days. 1 injection monthly) 10 mL 1  . diphenhydrAMINE (BENADRYL) 25 mg capsule Take 25 mg by mouth every 6 (six) hours as needed for allergies.    Marland Kitchen docusate sodium (COLACE) 100 MG capsule Take 100 mg by mouth daily as needed for mild constipation.    . furosemide (LASIX) 40 MG tablet TAKE ONE TABLET BY MOUTH ONCE DAILY 30 tablet 5  . glucose blood (FREESTYLE INSULINX TEST) test strip Use as instructed to check sugar 2 times per day dx code E11.65 100 each 5  . glucose monitoring kit (FREESTYLE) monitoring kit 1 each by Does not apply route as needed.      . hydrALAZINE (APRESOLINE) 50 MG tablet Take 1 tablet (50 mg total) by mouth 3 (three) times daily. 270 tablet 3  . KLOR-CON M20 20 MEQ tablet TAKE ONE TABLET BY MOUTH ONCE DAILY 90 tablet 1  . KRILL OIL PO Take 4 capsules by mouth daily.    Marland Kitchen levothyroxine (SYNTHROID, LEVOTHROID) 125 MCG tablet TAKE ONE TABLET BY MOUTH EVERY DAY,EXCEPT TAKE ONE AND ONE-HALF TABLET ON TUESDAYS,THURSDAYS AND SATURDAYS 180 tablet 3  . loperamide (IMODIUM) 2 MG capsule Take 2 mg by mouth 3 (three) times daily as needed for diarrhea or loose stools.    . metFORMIN (GLUCOPHAGE-XR) 500 MG 24 hr tablet Take 1-2 tablets (500-1,000 mg total) by mouth 2 (two) times daily. 2 in the morning and 1 in the evening. 90 tablet 3  . metroNIDAZOLE (FLAGYL) 500 MG tablet Take 1 tablet (500 mg total) by mouth 3 (three) times daily. 30 tablet 0  .  Multiple Vitamin (MULTIVITAMIN WITH MINERALS) TABS tablet Take 1 tablet by mouth daily.    . potassium chloride SA (KLOR-CON M20) 20 MEQ tablet Take 1 tablet (20 mEq total) by mouth daily. 90 tablet 1  . pravastatin (PRAVACHOL) 40 MG tablet Take 1 tablet (40 mg total) by mouth daily. 90 tablet 3  . Probiotic Product (PHILLIPS COLON HEALTH PO) Take 1 tablet by mouth daily.    . ramipril (ALTACE) 5 MG capsule TAKE ONE CAPSULE BY MOUTH ONCE DAILY FOR HYPERTENSION AND TO PREVENT DIABETIC KIDNEY DISEASE Yearly physical is due must see md for refills 30 capsule 0  . simethicone (MYLICON) 80 MG chewable tablet  Chew 80 mg by mouth every 6 (six) hours as needed for flatulence.    Marland Kitchen tetrahydrozoline 0.05 % ophthalmic solution Place 1 drop into both eyes 2 (two) times daily as needed (Eye allergies).    . vitamin E 400 UNIT capsule Take 400 Units by mouth daily.     No current facility-administered medications on file prior to visit.     Review of Systems Constitutional: Negative for increased diaphoresis, or other activity, appetite or siginficant weight change other than noted HENT: Negative for worsening hearing loss, ear pain, facial swelling, mouth sores and neck stiffness.   Eyes: Negative for other worsening pain, redness or visual disturbance.  Respiratory: Negative for choking or stridor Cardiovascular: Negative for other chest pain and palpitations.  Gastrointestinal: Negative for worsening diarrhea, blood in stool, or abdominal distention Genitourinary: Negative for hematuria, flank pain or change in urine volume.  Musculoskeletal: Negative for myalgias or other joint complaints.  Skin: Negative for other color change and wound or drainage.  Neurological: Negative for syncope and numbness. other than noted Hematological: Negative for adenopathy. or other swelling Psychiatric/Behavioral: Negative for hallucinations, SI, self-injury, decreased concentration or other worsening agitation.       Objective:   Physical Exam BP 130/72 mmHg  Pulse 91  Temp(Src) 98.7 F (37.1 C) (Oral)  Resp 20  Wt 213 lb (96.616 kg)  SpO2 95% VS noted,  Constitutional: Pt is oriented to person, place, and time. Appears well-developed and well-nourished, in no significant distress Head: Normocephalic and atraumatic  Eyes: Conjunctivae and EOM are normal. Pupils are equal, round, and reactive to light Right Ear: External ear normal.  Left Ear: External ear normal Nose: Nose normal.  Mouth/Throat: Oropharynx is clear and moist  Neck: Normal range of motion. Neck supple. No JVD present. No tracheal deviation present or significant neck LA or mass Cardiovascular: Normal rate, regular rhythm, normal heart sounds and intact distal pulses.   Pulmonary/Chest: Effort normal and breath sounds without rales or wheezing  Abdominal: Soft. Bowel sounds are normal. NT. No HSM  Musculoskeletal: Normal range of motion. Exhibits no edema Lymphadenopathy: Has no cervical adenopathy.  Neurological: Pt is alert and oriented to person, place, and time. Pt has normal reflexes. No cranial nerve deficit. Motor intact except 4+/5 LLE weakness, dtrs intact, sens intact, gait antalgic Skin: Skin is warm and dry. No rash noted or new ulcers Psychiatric:  Has normal mood and affect. Behavior is normal.  Left knee with nondiscrete somewhat firm/cystic area mid post knee , NT, no erythema    Assessment & Plan:

## 2015-07-06 NOTE — Assessment & Plan Note (Addendum)
Mod to severe with left LLE neuro change, for pain control, predpac, MRI LS spien and NS referral,  to f/u any worsening symptoms or concerns  In addition to the time spent performing CPE, I spent an additional 25 minutes face to face,in which greater than 50% of this time was spent in counseling and coordination of care for patient's acute illness as documented.

## 2015-07-06 NOTE — Progress Notes (Signed)
Pre visit review using our clinic review tool, if applicable. No additional management support is needed unless otherwise documented below in the visit note. 

## 2015-07-06 NOTE — Assessment & Plan Note (Signed)
stable overall by history and exam, recent data reviewed with pt, and pt to continue medical treatment as before,  to f/u any worsening symptoms or concerns ' BP Readings from Last 3 Encounters:  07/06/15 130/72  03/08/15 140/86  07/28/14 128/60

## 2015-07-06 NOTE — Patient Instructions (Addendum)
You had the Prevnar pneumonia shot today  Please take all new medication as prescribed  - the pain medication, and the prednisone  Please continue all other medications as before, and refills have been done if requested.  Please have the pharmacy call with any other refills you may need.  Please continue your efforts at being more active, low cholesterol diet, and weight control.  You are otherwise up to date with prevention measures today.  Please keep your appointments with your specialists as you may have planned  You will be contacted regarding the referral for: MRI for lower back, and Neurosurgury referral  Please go to the LAB in the Basement (turn left off the elevator) for the tests to be done today  You will be contacted by phone if any changes need to be made immediately.  Otherwise, you will receive a letter about your results with an explanation, but please check with MyChart first.  Please remember to sign up for MyChart if you have not done so, as this will be important to you in the future with finding out test results, communicating by private email, and scheduling acute appointments online when needed.  Please return in 6 months, or sooner if needed, with Lab testing done 3-5 days before

## 2015-07-06 NOTE — Assessment & Plan Note (Signed)
stable overall by history and exam, recent data reviewed with pt, and pt to continue medical treatment as before,  to f/u any worsening symptoms or concerns, for f/u a1c, f/u with endo as planned

## 2015-07-06 NOTE — Assessment & Plan Note (Signed)
stable overall by history and exam, recent data reviewed with pt, and pt to continue medical treatment as before,  to f/u any worsening symptoms or concerns Lab Results  Component Value Date   LDLCALC 61 07/06/2015

## 2015-07-06 NOTE — Assessment & Plan Note (Signed)

## 2015-07-13 ENCOUNTER — Ambulatory Visit
Admission: RE | Admit: 2015-07-13 | Discharge: 2015-07-13 | Disposition: A | Discharge status: Home / Self Care | Payer: Medicare Other | Source: Ambulatory Visit | Attending: Internal Medicine | Admitting: Internal Medicine

## 2015-07-13 ENCOUNTER — Encounter: Payer: Self-pay | Admitting: Internal Medicine

## 2015-07-13 DIAGNOSIS — M5416 Radiculopathy, lumbar region: Secondary | ICD-10-CM

## 2015-07-14 ENCOUNTER — Other Ambulatory Visit: Payer: Self-pay | Admitting: Internal Medicine

## 2015-07-19 ENCOUNTER — Telehealth: Payer: Self-pay

## 2015-07-19 NOTE — Telephone Encounter (Signed)
Call to fup regarding AWV; STated that her back hurts and has not heard from anyone; Dr. Jenny Reichmann sent letter out; Read her summary on letter per Dr. Jenny Reichmann and stated [facility] will give her a call to schedule and fup . Will call back in a couple of months to schedule AWV

## 2015-07-26 ENCOUNTER — Other Ambulatory Visit: Payer: Self-pay | Admitting: Neurosurgery

## 2015-07-26 DIAGNOSIS — M5416 Radiculopathy, lumbar region: Secondary | ICD-10-CM

## 2015-07-30 ENCOUNTER — Telehealth: Payer: Self-pay

## 2015-07-30 DIAGNOSIS — E038 Other specified hypothyroidism: Secondary | ICD-10-CM

## 2015-07-30 MED ORDER — METFORMIN HCL ER 500 MG PO TB24: ORAL_TABLET | ORAL | Status: DC

## 2015-07-30 MED ORDER — RAMIPRIL 5 MG PO CAPS: ORAL_CAPSULE | ORAL | Status: DC

## 2015-07-30 MED ORDER — LEVOTHYROXINE SODIUM 125 MCG PO TABS: ORAL_TABLET | ORAL | Status: DC

## 2015-07-30 MED ORDER — PRAVASTATIN SODIUM 40 MG PO TABS: 40.0000 mg | ORAL_TABLET | Freq: Every day | ORAL | Status: DC

## 2015-07-30 MED ORDER — HYDRALAZINE HCL 50 MG PO TABS: 50.0000 mg | ORAL_TABLET | Freq: Three times a day (TID) | ORAL | Status: DC

## 2015-07-30 MED ORDER — CARVEDILOL 25 MG PO TABS: ORAL_TABLET | ORAL | Status: DC

## 2015-07-30 MED ORDER — FUROSEMIDE 40 MG PO TABS: 40.0000 mg | ORAL_TABLET | Freq: Every day | ORAL | Status: DC

## 2015-07-30 NOTE — Telephone Encounter (Signed)
  Health insurance called and say that the patients ramipril had not been filled since [facility]. Also wants 90 days supply of all her medication. If they was 90days they would not cost her anything. They just wanted to call and give a FYI. If you have questions call Stanton Kidney at [phone].. I informed patient was here on June 2. It looks like a cpe was done? Do i need to sch app for a yearly or med reill? PLease advise, thank you.

## 2015-07-30 NOTE — Telephone Encounter (Signed)
Medication refills sent

## 2015-08-02 ENCOUNTER — Ambulatory Visit
Admission: RE | Admit: 2015-08-02 | Discharge: 2015-08-02 | Disposition: A | Discharge status: Home / Self Care | Payer: Medicare Other | Source: Ambulatory Visit | Attending: Neurosurgery | Admitting: Neurosurgery

## 2015-08-02 ENCOUNTER — Other Ambulatory Visit: Payer: Self-pay | Admitting: Neurosurgery

## 2015-08-02 DIAGNOSIS — M5416 Radiculopathy, lumbar region: Secondary | ICD-10-CM

## 2015-08-02 MED ORDER — METHYLPREDNISOLONE ACETATE 40 MG/ML INJ SUSP (RADIOLOG
120.0000 mg | Freq: Once | INTRAMUSCULAR | Status: AC
  Administered 2015-08-02: 120 mg via EPIDURAL

## 2015-08-02 MED ORDER — IOPAMIDOL (ISOVUE-M 200) INJECTION 41%
1.0000 mL | Freq: Once | INTRAMUSCULAR | Status: AC
Start: 2015-08-02 — End: 2015-08-02
  Administered 2015-08-02: 1 mL via EPIDURAL

## 2015-08-02 NOTE — Discharge Instructions (Signed)

## 2015-10-09 ENCOUNTER — Telehealth: Payer: Self-pay

## 2015-10-09 NOTE — Telephone Encounter (Signed)
Call to Ms. Sierra Fisher to schedule AWV. Left VM/ this is the 2nd outreach; was told to call the [place] office to schedule

## 2015-10-23 ENCOUNTER — Other Ambulatory Visit: Payer: Self-pay | Admitting: Neurosurgery

## 2015-10-23 DIAGNOSIS — M5416 Radiculopathy, lumbar region: Secondary | ICD-10-CM

## 2015-10-31 ENCOUNTER — Other Ambulatory Visit: Payer: Self-pay | Admitting: Neurosurgery

## 2015-10-31 ENCOUNTER — Ambulatory Visit
Admission: RE | Admit: 2015-10-31 | Discharge: 2015-10-31 | Disposition: A | Discharge status: Home / Self Care | Payer: Medicare Other | Source: Ambulatory Visit | Attending: Neurosurgery | Admitting: Neurosurgery

## 2015-10-31 VITALS — BP 224/88 | HR 93

## 2015-10-31 DIAGNOSIS — M5416 Radiculopathy, lumbar region: Secondary | ICD-10-CM

## 2015-10-31 MED ORDER — METHYLPREDNISOLONE ACETATE 40 MG/ML INJ SUSP (RADIOLOG
120.0000 mg | Freq: Once | INTRAMUSCULAR | Status: AC
  Administered 2015-10-31: 120 mg via EPIDURAL

## 2015-10-31 MED ORDER — IOPAMIDOL (ISOVUE-M 200) INJECTION 41%
1.0000 mL | Freq: Once | INTRAMUSCULAR | Status: AC
  Administered 2015-10-31: 1 mL via EPIDURAL

## 2015-11-15 ENCOUNTER — Other Ambulatory Visit: Payer: Self-pay | Admitting: Internal Medicine

## 2015-11-29 ENCOUNTER — Other Ambulatory Visit: Payer: Self-pay | Admitting: Internal Medicine

## 2015-12-03 LAB — HM DIABETES EYE EXAM

## 2015-12-04 ENCOUNTER — Telehealth: Payer: Self-pay | Admitting: *Deleted

## 2015-12-04 ENCOUNTER — Other Ambulatory Visit: Payer: Self-pay | Admitting: *Deleted

## 2015-12-04 MED ORDER — CYANOCOBALAMIN 1000 MCG/ML IJ SOLN: INTRAMUSCULAR | 1 refills | Status: AC

## 2015-12-04 MED ORDER — CYANOCOBALAMIN 1000 MCG/ML IJ SOLN: INTRAMUSCULAR | 5 refills | Status: DC

## 2015-12-04 NOTE — Telephone Encounter (Signed)
rec'd call pt requesting refill on

## 2015-12-05 NOTE — Telephone Encounter (Signed)
Rec'd call yesterday pt was needing refill on her B12. Rx was sent yesterday to [facility]...Sierra Fisher

## 2015-12-12 ENCOUNTER — Encounter: Payer: Self-pay | Admitting: Internal Medicine

## 2015-12-12 NOTE — Progress Notes (Signed)
Abstract done.

## 2015-12-21 ENCOUNTER — Other Ambulatory Visit: Payer: Self-pay | Admitting: Neurosurgery

## 2015-12-21 DIAGNOSIS — M5416 Radiculopathy, lumbar region: Secondary | ICD-10-CM

## 2015-12-26 ENCOUNTER — Other Ambulatory Visit: Payer: Medicare Other

## 2016-01-07 ENCOUNTER — Other Ambulatory Visit: Payer: Self-pay | Admitting: Internal Medicine

## 2016-01-08 ENCOUNTER — Other Ambulatory Visit: Payer: Self-pay | Admitting: Internal Medicine

## 2016-01-09 ENCOUNTER — Other Ambulatory Visit: Payer: Self-pay | Admitting: Internal Medicine

## 2016-01-09 ENCOUNTER — Ambulatory Visit
Admission: RE | Admit: 2016-01-09 | Discharge: 2016-01-09 | Disposition: A | Discharge status: Home / Self Care | Payer: Medicare Other | Source: Ambulatory Visit | Attending: Neurosurgery | Admitting: Neurosurgery

## 2016-01-09 VITALS — BP 171/77 | HR 72

## 2016-01-09 DIAGNOSIS — M5416 Radiculopathy, lumbar region: Secondary | ICD-10-CM

## 2016-01-09 MED ORDER — IOPAMIDOL (ISOVUE-M 200) INJECTION 41%
1.0000 mL | Freq: Once | INTRAMUSCULAR | Status: AC
  Administered 2016-01-09: 1 mL via EPIDURAL

## 2016-01-09 MED ORDER — METHYLPREDNISOLONE ACETATE 40 MG/ML INJ SUSP (RADIOLOG
120.0000 mg | Freq: Once | INTRAMUSCULAR | Status: AC
  Administered 2016-01-09: 120 mg via EPIDURAL

## 2016-01-09 NOTE — Telephone Encounter (Signed)
Done hardcopy to Corinne  

## 2016-01-10 ENCOUNTER — Other Ambulatory Visit: Payer: Self-pay | Admitting: Internal Medicine

## 2016-01-10 NOTE — Telephone Encounter (Signed)
faxed

## 2016-02-22 ENCOUNTER — Other Ambulatory Visit: Payer: Self-pay | Admitting: Internal Medicine

## 2016-03-06 ENCOUNTER — Ambulatory Visit (INDEPENDENT_AMBULATORY_CARE_PROVIDER_SITE_OTHER): Payer: Medicare HMO | Admitting: Internal Medicine

## 2016-03-06 VITALS — BP 138/80 | HR 105 | Temp 98.0°F | Resp 20 | Wt 221.0 lb

## 2016-03-06 DIAGNOSIS — I10 Essential (primary) hypertension: Secondary | ICD-10-CM

## 2016-03-06 DIAGNOSIS — M5416 Radiculopathy, lumbar region: Secondary | ICD-10-CM | POA: Diagnosis not present

## 2016-03-06 DIAGNOSIS — H1033 Unspecified acute conjunctivitis, bilateral: Secondary | ICD-10-CM

## 2016-03-06 DIAGNOSIS — Z794 Long term (current) use of insulin: Secondary | ICD-10-CM

## 2016-03-06 DIAGNOSIS — B379 Candidiasis, unspecified: Secondary | ICD-10-CM

## 2016-03-06 DIAGNOSIS — E1165 Type 2 diabetes mellitus with hyperglycemia: Secondary | ICD-10-CM

## 2016-03-06 DIAGNOSIS — H109 Unspecified conjunctivitis: Secondary | ICD-10-CM | POA: Insufficient documentation

## 2016-03-06 DIAGNOSIS — IMO0001 Reserved for inherently not codable concepts without codable children: Secondary | ICD-10-CM

## 2016-03-06 MED ORDER — ERYTHROMYCIN 5 MG/GM OP OINT: 1.0000 | TOPICAL_OINTMENT | Freq: Four times a day (QID) | OPHTHALMIC | 0 refills | Status: DC

## 2016-03-06 MED ORDER — FLUCONAZOLE 150 MG PO TABS: ORAL_TABLET | ORAL | 0 refills | Status: DC

## 2016-03-06 NOTE — Assessment & Plan Note (Signed)
Mild to mod, for antibx course,  to f/u any worsening symptoms or concerns 

## 2016-03-06 NOTE — Patient Instructions (Signed)
Please take all new medication as prescribed - the eye antibiotic, and diflucan for yeast  You can also take Delsym OTC for cough, and/or Mucinex (or it's generic off brand) for congestion, and tylenol as needed for pain.  Please continue all other medications as before, and refills have been done if requested.  Please have the pharmacy call with any other refills you may need.  Please keep your appointments with your specialists as you may have planned

## 2016-03-06 NOTE — Assessment & Plan Note (Signed)
stable overall by history and exam, recent data reviewed with pt, and pt to continue medical treatment as before,  to f/u any worsening symptoms or concerns BP Readings from Last 3 Encounters:  03/06/16 138/80  01/09/16 (!) 171/77  10/31/15 (!) 224/88

## 2016-03-06 NOTE — Assessment & Plan Note (Addendum)
stable overall by history and exam, recent data reviewed with pt, and pt to continue medical treatment as before,  to f/u any worsening symptoms or concerns Lab Results  Component Value Date   HGBA1C 9.5 (H) 07/06/2015   Cont f/u with endo

## 2016-03-06 NOTE — Progress Notes (Signed)
Pre visit review using our clinic review tool, if applicable. No additional management support is needed unless otherwise documented below in the visit note. 

## 2016-03-06 NOTE — Progress Notes (Signed)
Subjective:    Patient ID: Sierra Fisher, female    DOB: 10-12-1940, 76 y.o.   MRN: [id number]  HPI here with 2 days onset bilat eye redness/itch/burning/weepy mild to mod, low grade temp, constant, nothing makes better or worse.  Also with marked 5 days perineal /vulvar erythema improved but not resolved with otc antifungal. .Also has persistent left lumbar pain with radiation to the LLE, now s/p ESI x 3 and improved, to f/u NS soon/Dr Trenton Gammon.  Pt denies chest pain, increased sob or doe, wheezing, orthopnea, PND, increased LE swelling, palpitations, dizziness or syncope.   Pt denies polydipsia, polyuria     Past Medical History:  Diagnosis Date  . Anemia    B12 deficiency & iron deficiency  . Bladder cancer ([place])   . CHF (congestive heart failure) (HCC)    on Actos w/o complication  . Diabetes mellitus    type 2  . Fibromyalgia   . Hypertension   . Thyroid disease    hypothyroidism   Past Surgical History:  Procedure Laterality Date  . ABDOMINAL HYSTERECTOMY     for ovarian cyst  . BLADDER SURGERY    . BREAST LUMPECTOMY    . BREAST SURGERY    . CARDIAC CATHETERIZATION     negative  . CATARACT EXTRACTION  03/2004,05/2004  . CHOLECYSTECTOMY    . COLONOSCOPY      X 2; colitis X 1  . COLONOSCOPY WITH PROPOFOL N/A 04/21/2014   Procedure: COLONOSCOPY WITH PROPOFOL;  Surgeon: Carol Ada, MD;  Location: [facility];  Service: Endoscopy;  Laterality: N/A;  . ESOPHAGOGASTRODUODENOSCOPY (EGD) WITH PROPOFOL N/A 04/21/2014   Procedure: ESOPHAGOGASTRODUODENOSCOPY (EGD) WITH PROPOFOL;  Surgeon: Carol Ada, MD;  Location: [facility];  Service: Endoscopy;  Laterality: N/A;    reports that she quit smoking about 41 years ago. She does not have any smokeless tobacco history on file. She reports that she does not drink alcohol or use drugs. family history is not on file. She was adopted. No Known Allergies Current Outpatient Prescriptions on File Prior to Visit  Medication Sig Dispense  Refill  . acetaminophen (TYLENOL) 500 MG tablet Take 500 mg by mouth every 6 (six) hours as needed (Pain).    Marland Kitchen amoxicillin (AMOXIL) 500 MG capsule Take 1 capsule (500 mg total) by mouth 3 (three) times daily. 30 capsule 0  . aspirin 81 MG EC tablet Take 81 mg by mouth daily.      . carvedilol (COREG) 25 MG tablet TAKE ONE TABLET BY MOUTH TWICE DAILY 60 tablet 0  . carvedilol (COREG) 25 MG tablet Take 1 tablet by mouth twice daily. 180 tablet 3  . chlorthalidone (HYGROTON) 25 MG tablet TAKE ONE-HALF TABLET BY MOUTH ONCE DAILY 45 tablet 2  . cholecalciferol (VITAMIN D) 1000 UNITS tablet Take 1,000 Units by mouth daily.    . Cholecalciferol (VITAMIN D3) 400 UNITS CAPS Take by mouth daily.    . ciprofloxacin (CIPRO) 500 MG tablet Take 1 tablet (500 mg total) by mouth 2 (two) times daily. 20 tablet 0  . cyanocobalamin (,VITAMIN B-12,) 1000 MCG/ML injection 1 INJECTION MONTHLY 3 mL 1  . diphenhydrAMINE (BENADRYL) 25 mg capsule Take 25 mg by mouth every 6 (six) hours as needed for allergies.    Marland Kitchen docusate sodium (COLACE) 100 MG capsule Take 100 mg by mouth daily as needed for mild constipation.    . furosemide (LASIX) 40 MG tablet Take 1 tablet (40 mg total) by mouth daily. 30 tablet  5  . glucose blood (FREESTYLE INSULINX TEST) test strip Use as instructed to check sugar 2 times per day dx code E11.65 100 each 5  . glucose monitoring kit (FREESTYLE) monitoring kit 1 each by Does not apply route as needed.      . hydrALAZINE (APRESOLINE) 50 MG tablet Take 1 tablet (50 mg total) by mouth 3 (three) times daily. 270 tablet 3  . insulin NPH Human (NOVOLIN N) 100 UNIT/ML injection Inject 25 units in am and pm 30 mL 11  . insulin regular (NOVOLIN R,HUMULIN R) 100 units/mL injection Inject 0.3 mLs (30 Units total) into the skin 2 (two) times daily. 10 mL 11  . KLOR-CON M20 20 MEQ tablet TAKE ONE TABLET BY MOUTH ONCE DAILY 90 tablet 1  . KRILL OIL PO Take 4 capsules by mouth daily.    Marland Kitchen levothyroxine  (SYNTHROID, LEVOTHROID) 125 MCG tablet TAKE ONE TABLET BY MOUTH EVERY DAY,EXCEPT TAKE ONE AND ONE-HALF TABLET ON TUESDAYS,THURSDAYS AND SATURDAYS 180 tablet 3  . loperamide (IMODIUM) 2 MG capsule Take 2 mg by mouth 3 (three) times daily as needed for diarrhea or loose stools.    . metFORMIN (GLUCOPHAGE-XR) 500 MG 24 hr tablet TAKE ONE TO TWO TABLETS BY MOUTH TWICE DAILY 2  IN  THE  MORNING  AND  1  IN  THE  EVENING 90 tablet 6  . metroNIDAZOLE (FLAGYL) 500 MG tablet Take 1 tablet (500 mg total) by mouth 3 (three) times daily. 30 tablet 0  . Multiple Vitamin (MULTIVITAMIN WITH MINERALS) TABS tablet Take 1 tablet by mouth daily.    . potassium chloride SA (KLOR-CON M20) 20 MEQ tablet Take 1 tablet (20 mEq total) by mouth daily. 90 tablet 1  . pravastatin (PRAVACHOL) 40 MG tablet Take 1 tablet (40 mg total) by mouth daily. 90 tablet 3  . predniSONE (DELTASONE) 10 MG tablet 3 tabs by mouth per day for 3 days,2tabs per day for 3 days,1tab per day for 3 days 18 tablet 0  . Probiotic Product (PHILLIPS COLON HEALTH PO) Take 1 tablet by mouth daily.    . ramipril (ALTACE) 5 MG capsule TAKE ONE CAPSULE BY MOUTH ONCE DAILY FOR  HYPERTENSION  AND  TO  PREVENT  DIABETIC  KIDNEY  DISEASE 30 capsule 0  . simethicone (MYLICON) 80 MG chewable tablet Chew 80 mg by mouth every 6 (six) hours as needed for flatulence.    Marland Kitchen tetrahydrozoline 0.05 % ophthalmic solution Place 1 drop into both eyes 2 (two) times daily as needed (Eye allergies).    . traMADol (ULTRAM) 50 MG tablet TAKE ONE TABLET BY MOUTH EVERY 8 HOURS AS NEEDED 60 tablet 2  . vitamin E 400 UNIT capsule Take 400 Units by mouth daily.     No current facility-administered medications on file prior to visit.    Review of Systems  Constitutional: Negative for unusual diaphoresis or night sweats HENT: Negative for ear swelling or discharge Eyes: Negative for worsening visual haziness  Respiratory: Negative for choking and stridor.   Gastrointestinal: Negative  for distension or worsening eructation Genitourinary: Negative for retention or change in urine volume.  Musculoskeletal: Negative for other MSK pain or swelling Skin: Negative for color change and worsening wound Neurological: Negative for tremors and numbness other than noted  Psychiatric/Behavioral: Negative for decreased concentration or agitation other than above   All other system neg per pt    Objective:   Physical Exam BP 138/80   Pulse (!) 105  Temp 98 F (36.7 C) (Oral)   Resp 20   Wt 221 lb (100.2 kg)   SpO2 98%   BMI 39.15 kg/m  VS noted,  Constitutional: Pt appears in no apparent distress HENT: Head: NCAT.  Right Ear: External ear normal.  Left Ear: External ear normal.  Eyes: . Pupils are equal, round, and reactive to light. Conjunctivae with bilat erythema and somewhat cloudy weepiness, and EOM are normal \Bilat tm's without erythema.  Max sinus areas non tender.  Pharynx with mild erythema, no exudate Neck: Normal range of motion. Neck supple.  Cardiovascular: Normal rate and regular rhythm.   Pulmonary/Chest: Effort normal and breath sounds without rales or wheezing.  Neurological: Pt is alert. Not confused , motor grossly intact Skin: Skin is warm. No rash, no LE edema Psychiatric: Pt behavior is normal. No agitation.  No other new exam findings    Assessment & Plan:

## 2016-03-06 NOTE — Assessment & Plan Note (Signed)
Improved, to f/u ns as planned

## 2016-03-10 ENCOUNTER — Telehealth: Payer: Self-pay | Admitting: Internal Medicine

## 2016-03-11 ENCOUNTER — Encounter: Payer: Self-pay | Admitting: Nurse Practitioner

## 2016-03-11 ENCOUNTER — Ambulatory Visit (INDEPENDENT_AMBULATORY_CARE_PROVIDER_SITE_OTHER): Payer: Medicare HMO | Admitting: Nurse Practitioner

## 2016-03-11 VITALS — BP 134/90 | HR 79 | Temp 98.7°F | Wt 219.0 lb

## 2016-03-11 DIAGNOSIS — E039 Hypothyroidism, unspecified: Secondary | ICD-10-CM | POA: Diagnosis not present

## 2016-03-11 DIAGNOSIS — H1033 Unspecified acute conjunctivitis, bilateral: Secondary | ICD-10-CM

## 2016-03-11 DIAGNOSIS — J0100 Acute maxillary sinusitis, unspecified: Secondary | ICD-10-CM | POA: Diagnosis not present

## 2016-03-11 MED ORDER — OLOPATADINE HCL 0.2 % OP SOLN
1.0000 [drp] | Freq: Every day | OPHTHALMIC | 0 refills | Status: AC
Start: 2016-03-11 — End: ?

## 2016-03-11 MED ORDER — AZITHROMYCIN 250 MG PO TABS: 250.0000 mg | ORAL_TABLET | Freq: Every day | ORAL | 0 refills | Status: DC

## 2016-03-11 MED ORDER — LEVOTHYROXINE SODIUM 125 MCG PO TABS: ORAL_TABLET | ORAL | 3 refills | Status: DC

## 2016-03-11 MED ORDER — BENZONATATE 100 MG PO CAPS: 100.0000 mg | ORAL_CAPSULE | Freq: Three times a day (TID) | ORAL | 0 refills | Status: DC | PRN

## 2016-03-11 MED ORDER — FLUTICASONE PROPIONATE 50 MCG/ACT NA SUSP: 2.0000 | Freq: Every day | NASAL | 0 refills | Status: AC

## 2016-03-11 MED ORDER — DM-GUAIFENESIN ER 30-600 MG PO TB12: 1.0000 | ORAL_TABLET | Freq: Two times a day (BID) | ORAL | 0 refills | Status: DC | PRN

## 2016-03-11 NOTE — Progress Notes (Signed)
Pre visit review using our clinic review tool, if applicable. No additional management support is needed unless otherwise documented below in the visit note. 

## 2016-03-11 NOTE — Patient Instructions (Signed)

## 2016-03-11 NOTE — Progress Notes (Signed)
Subjective:  Patient ID: Sierra Fisher, female    DOB: 07/31/1940  Age: 76 y.o. MRN: [id number]  CC: Sinus Problem (sneezing,sore throat,cough with yellow mucus,eyes pink, feel bad going on for 1 wk. )   Sinus Problem  This is a new problem. The current episode started in the past 7 days. The problem has been gradually worsening since onset. There has been no fever. Associated symptoms include chills, congestion, coughing, headaches, a hoarse voice, sinus pressure, a sore throat and swollen glands. Pertinent negatives include no diaphoresis, ear pain, neck pain or shortness of breath. Past treatments include oral decongestants. The treatment provided no relief.    Outpatient Medications Prior to Visit  Medication Sig Dispense Refill  . acetaminophen (TYLENOL) 500 MG tablet Take 500 mg by mouth every 6 (six) hours as needed (Pain).    Marland Kitchen aspirin 81 MG EC tablet Take 81 mg by mouth daily.      . carvedilol (COREG) 25 MG tablet TAKE ONE TABLET BY MOUTH TWICE DAILY 60 tablet 0  . carvedilol (COREG) 25 MG tablet Take 1 tablet by mouth twice daily. 180 tablet 3  . chlorthalidone (HYGROTON) 25 MG tablet TAKE ONE-HALF TABLET BY MOUTH ONCE DAILY 45 tablet 2  . cholecalciferol (VITAMIN D) 1000 UNITS tablet Take 1,000 Units by mouth daily.    . Cholecalciferol (VITAMIN D3) 400 UNITS CAPS Take by mouth daily.    . cyanocobalamin (,VITAMIN B-12,) 1000 MCG/ML injection 1 INJECTION MONTHLY 3 mL 1  . diphenhydrAMINE (BENADRYL) 25 mg capsule Take 25 mg by mouth every 6 (six) hours as needed for allergies.    Marland Kitchen docusate sodium (COLACE) 100 MG capsule Take 100 mg by mouth daily as needed for mild constipation.    . fluconazole (DIFLUCAN) 150 MG tablet 1 tab by mouth every 3 days as needed 4 tablet 0  . furosemide (LASIX) 40 MG tablet Take 1 tablet (40 mg total) by mouth daily. 30 tablet 5  . glucose blood (FREESTYLE INSULINX TEST) test strip Use as instructed to check sugar 2 times per day dx code E11.65 100  each 5  . glucose monitoring kit (FREESTYLE) monitoring kit 1 each by Does not apply route as needed.      . hydrALAZINE (APRESOLINE) 50 MG tablet Take 1 tablet (50 mg total) by mouth 3 (three) times daily. 270 tablet 3  . insulin NPH Human (NOVOLIN N) 100 UNIT/ML injection Inject 25 units in am and pm 30 mL 11  . insulin regular (NOVOLIN R,HUMULIN R) 100 units/mL injection Inject 0.3 mLs (30 Units total) into the skin 2 (two) times daily. 10 mL 11  . KLOR-CON M20 20 MEQ tablet TAKE ONE TABLET BY MOUTH ONCE DAILY 90 tablet 1  . KRILL OIL PO Take 4 capsules by mouth daily.    Marland Kitchen loperamide (IMODIUM) 2 MG capsule Take 2 mg by mouth 3 (three) times daily as needed for diarrhea or loose stools.    . metFORMIN (GLUCOPHAGE-XR) 500 MG 24 hr tablet TAKE ONE TO TWO TABLETS BY MOUTH TWICE DAILY 2  IN  THE  MORNING  AND  1  IN  THE  EVENING 90 tablet 6  . Multiple Vitamin (MULTIVITAMIN WITH MINERALS) TABS tablet Take 1 tablet by mouth daily.    . potassium chloride SA (KLOR-CON M20) 20 MEQ tablet Take 1 tablet (20 mEq total) by mouth daily. 90 tablet 1  . pravastatin (PRAVACHOL) 40 MG tablet Take 1 tablet (40 mg total) by mouth  daily. 90 tablet 3  . Probiotic Product (PHILLIPS COLON HEALTH PO) Take 1 tablet by mouth daily.    . ramipril (ALTACE) 5 MG capsule TAKE ONE CAPSULE BY MOUTH ONCE DAILY FOR  HYPERTENSION  AND  TO  PREVENT  DIABETIC  KIDNEY  DISEASE 30 capsule 0  . simethicone (MYLICON) 80 MG chewable tablet Chew 80 mg by mouth every 6 (six) hours as needed for flatulence.    Marland Kitchen tetrahydrozoline 0.05 % ophthalmic solution Place 1 drop into both eyes 2 (two) times daily as needed (Eye allergies).    . traMADol (ULTRAM) 50 MG tablet TAKE ONE TABLET BY MOUTH EVERY 8 HOURS AS NEEDED 60 tablet 2  . vitamin E 400 UNIT capsule Take 400 Units by mouth daily.    . ciprofloxacin (CIPRO) 500 MG tablet Take 1 tablet (500 mg total) by mouth 2 (two) times daily. 20 tablet 0  . erythromycin ophthalmic ointment Place 1  application into both eyes 4 (four) times daily. 3.5 g 0  . levothyroxine (SYNTHROID, LEVOTHROID) 125 MCG tablet TAKE ONE TABLET BY MOUTH EVERY DAY,EXCEPT TAKE ONE AND ONE-HALF TABLET ON TUESDAYS,THURSDAYS AND SATURDAYS 180 tablet 3  . metroNIDAZOLE (FLAGYL) 500 MG tablet Take 1 tablet (500 mg total) by mouth 3 (three) times daily. 30 tablet 0  . predniSONE (DELTASONE) 10 MG tablet 3 tabs by mouth per day for 3 days,2tabs per day for 3 days,1tab per day for 3 days 18 tablet 0  . amoxicillin (AMOXIL) 500 MG capsule Take 1 capsule (500 mg total) by mouth 3 (three) times daily. (Patient not taking: Reported on 03/11/2016) 30 capsule 0   No facility-administered medications prior to visit.     ROS See HPI  Objective:  BP 134/90   Pulse 79   Temp 98.7 F (37.1 C)   Wt 219 lb (99.3 kg)   SpO2 97%   BMI 38.79 kg/m   BP Readings from Last 3 Encounters:  03/11/16 134/90  03/06/16 138/80  01/09/16 (!) 171/77    Wt Readings from Last 3 Encounters:  03/11/16 219 lb (99.3 kg)  03/06/16 221 lb (100.2 kg)  07/06/15 213 lb (96.6 kg)    Physical Exam  Constitutional: She is oriented to person, place, and time.  HENT:  Right Ear: Tympanic membrane, external ear and ear canal normal.  Left Ear: Tympanic membrane, external ear and ear canal normal.  Nose: Mucosal edema and rhinorrhea present. Right sinus exhibits maxillary sinus tenderness. Right sinus exhibits no frontal sinus tenderness. Left sinus exhibits maxillary sinus tenderness. Left sinus exhibits no frontal sinus tenderness.  Mouth/Throat: Uvula is midline. No trismus in the jaw. Posterior oropharyngeal erythema present. No oropharyngeal exudate.  Eyes: No scleral icterus.  Neck: Normal range of motion. Neck supple.  Cardiovascular: Normal rate and normal heart sounds.   Pulmonary/Chest: Effort normal and breath sounds normal.  Musculoskeletal: She exhibits no edema.  Lymphadenopathy:    She has cervical adenopathy.  Neurological:  She is alert and oriented to person, place, and time.  Vitals reviewed.   Lab Results  Component Value Date   WBC 11.8 (H) 07/06/2015   HGB 14.3 07/06/2015   HCT 42.1 07/06/2015   PLT 308.0 07/06/2015   GLUCOSE 211 (H) 07/06/2015   CHOL 131 07/06/2015   TRIG 170.0 (H) 07/06/2015   HDL 36.10 (L) 07/06/2015   LDLDIRECT 96.7 10/19/2012   LDLCALC 61 07/06/2015   ALT 30 07/06/2015   AST 25 07/06/2015   NA 136 07/06/2015  K 3.9 07/06/2015   CL 100 07/06/2015   CREATININE 0.81 07/06/2015   BUN 15 07/06/2015   CO2 27 07/06/2015   TSH 5.92 (H) 07/06/2015   HGBA1C 9.5 (H) 07/06/2015   MICROALBUR 2.7 (H) 07/06/2015    Dg Inject Diag/thera/inc Needle/cath/plc Epi/lumb/sac W/img  Result Date: 01/09/2016 CLINICAL DATA:  Lumbosacral spondylosis without myelopathy. Left-sided low back and left lower extremity pain. The patient reports substantial relief from the 2 prior left L5 nerve root blocks, however the relief has not been as sustained as she would like. 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 An L4-5 interlaminar injection will be performed today with the hopes of obtaining more sustained relief as well as addressing some new mild right-sided low back pain. FLUOROSCOPY TIME:  Radiation Exposure Index (as provided by the fluoroscopic device): 15.92 microGray*m^2 Fluoroscopy Time (in minutes and seconds):  9 seconds PROCEDURE: The procedure, risks, benefits, and alternatives were explained to the patient. Questions regarding the procedure were encouraged and answered. The patient understands and consents to the procedure. LUMBAR EPIDURAL INJECTION: An interlaminar approach was performed on the left at L4-5. The overlying skin was cleansed and anesthetized. A 3.5 inch 20 gauge epidural needle was advanced using loss-of-resistance technique. DIAGNOSTIC EPIDURAL INJECTION: Injection of Isovue-M 200 shows a good epidural pattern with spread above and below the level of needle  placement, primarily on the left. No vascular opacification is seen. THERAPEUTIC EPIDURAL INJECTION: 120 mg of Depo-Medrol mixed with 3 mL of 1% lidocaine were instilled. The procedure was well-tolerated, and the patient was discharged thirty minutes following the injection in good condition. COMPLICATIONS: None IMPRESSION: Technically successful lumbar interlaminar epidural injection on the left at L4-5. Electronically Signed   By: Logan Bores M.D.   On: 01/09/2016 15:20    Assessment & Plan:   Sierra Fisher was seen today for sinus problem.  Diagnoses and all orders for this visit:  Acute non-recurrent maxillary sinusitis -     dextromethorphan-guaiFENesin (MUCINEX DM) 30-600 MG 12hr tablet; Take 1 tablet by mouth 2 (two) times daily as needed for cough. -     azithromycin (ZITHROMAX Z-PAK) 250 MG tablet; Take 1 tablet (250 mg total) by mouth daily. Take 2tabs on first day, then 1tab once a day till complete -     benzonatate (TESSALON) 100 MG capsule; Take 1 capsule (100 mg total) by mouth 3 (three) times daily as needed for cough. -     fluticasone (FLONASE) 50 MCG/ACT nasal spray; Place 2 sprays into both nostrils daily.  Acute conjunctivitis of both eyes, unspecified acute conjunctivitis type -     Olopatadine HCl (PATADAY) 0.2 % SOLN; Apply 1 drop to eye daily.  Hypothyroidism, unspecified type -     levothyroxine (SYNTHROID, LEVOTHROID) 125 MCG tablet; TAKE ONE TABLET BY MOUTH EVERY DAY,EXCEPT TAKE ONE AND ONE-HALF TABLET ON TUESDAYS,THURSDAYS AND SATURDAYS   I have discontinued Ms. Savoia's amoxicillin, ciprofloxacin, metroNIDAZOLE, predniSONE, and erythromycin. I am also having her start on dextromethorphan-guaiFENesin, azithromycin, benzonatate, Olopatadine HCl, and fluticasone. Additionally, I am having her maintain her aspirin, glucose monitoring kit, Vitamin D3, glucose blood, potassium chloride SA, docusate sodium, Probiotic Product (Burns PO), diphenhydrAMINE, KRILL OIL  PO, acetaminophen, vitamin E, multivitamin with minerals, cholecalciferol, simethicone, loperamide, tetrahydrozoline, carvedilol, insulin NPH Human, insulin regular, carvedilol, furosemide, hydrALAZINE, metFORMIN, pravastatin, cyanocobalamin, traMADol, chlorthalidone, ramipril, KLOR-CON M20, fluconazole, and levothyroxine.  Meds ordered this encounter  Medications  . dextromethorphan-guaiFENesin (MUCINEX DM) 30-600 MG 12hr tablet  Sig: Take 1 tablet by mouth 2 (two) times daily as needed for cough.    Dispense:  14 tablet    Refill:  0    Order Specific Question:   Supervising Provider    Answer:   Cassandria Anger [[id number]]  . azithromycin (ZITHROMAX Z-PAK) 250 MG tablet    Sig: Take 1 tablet (250 mg total) by mouth daily. Take 2tabs on first day, then 1tab once a day till complete    Dispense:  6 tablet    Refill:  0    Order Specific Question:   Supervising Provider    Answer:   Cassandria Anger [[id number]]  . benzonatate (TESSALON) 100 MG capsule    Sig: Take 1 capsule (100 mg total) by mouth 3 (three) times daily as needed for cough.    Dispense:  20 capsule    Refill:  0    Order Specific Question:   Supervising Provider    Answer:   Cassandria Anger [[id number]]  . Olopatadine HCl (PATADAY) 0.2 % SOLN    Sig: Apply 1 drop to eye daily.    Dispense:  2.5 mL    Refill:  0    Order Specific Question:   Supervising Provider    Answer:   Cassandria Anger [[id number]]  . levothyroxine (SYNTHROID, LEVOTHROID) 125 MCG tablet    Sig: TAKE ONE TABLET BY MOUTH EVERY DAY,EXCEPT TAKE ONE AND ONE-HALF TABLET ON TUESDAYS,THURSDAYS AND SATURDAYS    Dispense:  180 tablet    Refill:  3    Order Specific Question:   Supervising Provider    Answer:   Cassandria Anger [[id number]]  . fluticasone (FLONASE) 50 MCG/ACT nasal spray    Sig: Place 2 sprays into both nostrils daily.    Dispense:  16 g    Refill:  0    Order Specific Question:   Supervising Provider    Answer:   Cassandria Anger  [[id number]]    Follow-up: Return if symptoms worsen or fail to improve.  Wilfred Lacy, NP

## 2016-03-26 ENCOUNTER — Other Ambulatory Visit: Payer: Self-pay | Admitting: Internal Medicine

## 2016-05-28 ENCOUNTER — Telehealth: Payer: Self-pay | Admitting: Internal Medicine

## 2016-05-28 MED ORDER — FLUCONAZOLE 150 MG PO TABS: ORAL_TABLET | ORAL | 1 refills | Status: DC

## 2016-05-28 NOTE — Telephone Encounter (Signed)
Sorry, this was done erx

## 2016-05-28 NOTE — Telephone Encounter (Signed)
Done hardcopy to Shirron  

## 2016-05-28 NOTE — Telephone Encounter (Signed)
Patient was informed. She will pick up rx at pharmacy. Thank you.

## 2016-05-28 NOTE — Telephone Encounter (Signed)
[place], [place] - [address] [phone] (Phone) [phone] (Fax)   Patient states she has a really bad yeast infections. It has been going on for about a week now. She would like to know if you could call something in for this. Please advise if patient needs to make an appointment. Thank you.

## 2016-05-30 NOTE — Telephone Encounter (Signed)
error 

## 2016-06-03 ENCOUNTER — Other Ambulatory Visit: Payer: Self-pay

## 2016-06-03 MED ORDER — METFORMIN HCL ER 500 MG PO TB24: ORAL_TABLET | ORAL | 0 refills | Status: DC

## 2016-07-02 DIAGNOSIS — R42 Dizziness and giddiness: Secondary | ICD-10-CM | POA: Diagnosis not present

## 2016-07-02 DIAGNOSIS — Z0000 Encounter for general adult medical examination without abnormal findings: Secondary | ICD-10-CM | POA: Diagnosis not present

## 2016-07-02 DIAGNOSIS — I110 Hypertensive heart disease with heart failure: Secondary | ICD-10-CM | POA: Diagnosis not present

## 2016-07-02 DIAGNOSIS — E785 Hyperlipidemia, unspecified: Secondary | ICD-10-CM | POA: Diagnosis not present

## 2016-07-02 DIAGNOSIS — R3915 Urgency of urination: Secondary | ICD-10-CM | POA: Diagnosis not present

## 2016-07-02 DIAGNOSIS — R143 Flatulence: Secondary | ICD-10-CM | POA: Diagnosis not present

## 2016-07-02 DIAGNOSIS — R0600 Dyspnea, unspecified: Secondary | ICD-10-CM | POA: Diagnosis not present

## 2016-07-02 DIAGNOSIS — J309 Allergic rhinitis, unspecified: Secondary | ICD-10-CM | POA: Diagnosis not present

## 2016-07-02 DIAGNOSIS — Z6838 Body mass index (BMI) 38.0-38.9, adult: Secondary | ICD-10-CM | POA: Diagnosis not present

## 2016-07-02 DIAGNOSIS — E039 Hypothyroidism, unspecified: Secondary | ICD-10-CM | POA: Diagnosis not present

## 2016-07-02 DIAGNOSIS — Z7951 Long term (current) use of inhaled steroids: Secondary | ICD-10-CM | POA: Diagnosis not present

## 2016-07-02 DIAGNOSIS — E119 Type 2 diabetes mellitus without complications: Secondary | ICD-10-CM | POA: Diagnosis not present

## 2016-07-02 DIAGNOSIS — Z794 Long term (current) use of insulin: Secondary | ICD-10-CM | POA: Diagnosis not present

## 2016-07-02 DIAGNOSIS — I509 Heart failure, unspecified: Secondary | ICD-10-CM | POA: Diagnosis not present

## 2016-07-02 DIAGNOSIS — G4700 Insomnia, unspecified: Secondary | ICD-10-CM | POA: Diagnosis not present

## 2016-07-02 DIAGNOSIS — K580 Irritable bowel syndrome with diarrhea: Secondary | ICD-10-CM | POA: Diagnosis not present

## 2016-07-02 DIAGNOSIS — R233 Spontaneous ecchymoses: Secondary | ICD-10-CM | POA: Diagnosis not present

## 2016-07-02 DIAGNOSIS — Z7982 Long term (current) use of aspirin: Secondary | ICD-10-CM | POA: Diagnosis not present

## 2016-07-02 DIAGNOSIS — E669 Obesity, unspecified: Secondary | ICD-10-CM | POA: Diagnosis not present

## 2016-07-16 DIAGNOSIS — N3946 Mixed incontinence: Secondary | ICD-10-CM | POA: Diagnosis not present

## 2016-07-16 DIAGNOSIS — Z8551 Personal history of malignant neoplasm of bladder: Secondary | ICD-10-CM | POA: Diagnosis not present

## 2016-07-16 DIAGNOSIS — C679 Malignant neoplasm of bladder, unspecified: Secondary | ICD-10-CM | POA: Diagnosis not present

## 2016-07-24 ENCOUNTER — Other Ambulatory Visit: Payer: Self-pay | Admitting: Internal Medicine

## 2016-07-25 NOTE — Telephone Encounter (Signed)
Done hardcopy to Shirron  

## 2016-07-25 NOTE — Telephone Encounter (Signed)
faxed

## 2016-08-18 ENCOUNTER — Other Ambulatory Visit: Payer: Self-pay | Admitting: Internal Medicine

## 2016-08-18 DIAGNOSIS — E038 Other specified hypothyroidism: Secondary | ICD-10-CM

## 2016-08-18 NOTE — Telephone Encounter (Signed)
Pt Overdue for annual appt w/labs sent only 30 day until appt...Sierra Fisher

## 2016-08-24 ENCOUNTER — Other Ambulatory Visit: Payer: Self-pay | Admitting: Internal Medicine

## 2016-08-27 ENCOUNTER — Encounter: Payer: Self-pay | Admitting: Internal Medicine

## 2016-08-27 ENCOUNTER — Other Ambulatory Visit (INDEPENDENT_AMBULATORY_CARE_PROVIDER_SITE_OTHER): Payer: Medicare HMO

## 2016-08-27 ENCOUNTER — Ambulatory Visit (INDEPENDENT_AMBULATORY_CARE_PROVIDER_SITE_OTHER): Payer: Medicare HMO | Admitting: Internal Medicine

## 2016-08-27 ENCOUNTER — Other Ambulatory Visit: Payer: Self-pay | Admitting: Internal Medicine

## 2016-08-27 VITALS — BP 144/88 | HR 82 | Ht 63.0 in | Wt 208.0 lb

## 2016-08-27 DIAGNOSIS — E1165 Type 2 diabetes mellitus with hyperglycemia: Secondary | ICD-10-CM | POA: Diagnosis not present

## 2016-08-27 DIAGNOSIS — B379 Candidiasis, unspecified: Secondary | ICD-10-CM

## 2016-08-27 DIAGNOSIS — Z794 Long term (current) use of insulin: Secondary | ICD-10-CM

## 2016-08-27 DIAGNOSIS — L309 Dermatitis, unspecified: Secondary | ICD-10-CM

## 2016-08-27 DIAGNOSIS — Z23 Encounter for immunization: Secondary | ICD-10-CM

## 2016-08-27 DIAGNOSIS — IMO0001 Reserved for inherently not codable concepts without codable children: Secondary | ICD-10-CM

## 2016-08-27 DIAGNOSIS — Z0001 Encounter for general adult medical examination with abnormal findings: Secondary | ICD-10-CM

## 2016-08-27 DIAGNOSIS — R7989 Other specified abnormal findings of blood chemistry: Secondary | ICD-10-CM | POA: Diagnosis not present

## 2016-08-27 DIAGNOSIS — Z0000 Encounter for general adult medical examination without abnormal findings: Secondary | ICD-10-CM | POA: Diagnosis not present

## 2016-08-27 LAB — CBC WITH DIFFERENTIAL/PLATELET
BASOS ABS: 0.1 10*3/uL (ref 0.0–0.1)
Basophils Relative: 0.8 % (ref 0.0–3.0)
EOS PCT: 2.2 % (ref 0.0–5.0)
Eosinophils Absolute: 0.2 10*3/uL (ref 0.0–0.7)
HCT: 41.6 % (ref 36.0–46.0)
HEMOGLOBIN: 14.1 g/dL (ref 12.0–15.0)
Lymphocytes Relative: 20.1 % (ref 12.0–46.0)
Lymphs Abs: 2 10*3/uL (ref 0.7–4.0)
MCHC: 33.8 g/dL (ref 30.0–36.0)
MCV: 85.8 fl (ref 78.0–100.0)
MONO ABS: 0.9 10*3/uL (ref 0.1–1.0)
MONOS PCT: 9.1 % (ref 3.0–12.0)
Neutro Abs: 6.8 10*3/uL (ref 1.4–7.7)
Neutrophils Relative %: 67.8 % (ref 43.0–77.0)
Platelets: 221 10*3/uL (ref 150.0–400.0)
RBC: 4.85 Mil/uL (ref 3.87–5.11)
RDW: 13.3 % (ref 11.5–15.5)
WBC: 10.1 10*3/uL (ref 4.0–10.5)

## 2016-08-27 LAB — LIPID PANEL
CHOLESTEROL: 143 mg/dL (ref 0–200)
HDL: 44.6 mg/dL (ref >39.00)
NonHDL: 98.45
Total CHOL/HDL Ratio: 3
Triglycerides: 264 mg/dL — ABNORMAL HIGH (ref 0.0–149.0)
VLDL: 52.8 mg/dL — AB (ref 0.0–40.0)

## 2016-08-27 LAB — URINALYSIS, ROUTINE W REFLEX MICROSCOPIC
BILIRUBIN URINE: NEGATIVE
HGB URINE DIPSTICK: NEGATIVE
KETONES UR: NEGATIVE
LEUKOCYTES UA: NEGATIVE
NITRITE: NEGATIVE
RBC / HPF: NONE SEEN (ref 0–?)
Specific Gravity, Urine: 1.015 (ref 1.000–1.030)
TOTAL PROTEIN, URINE-UPE24: NEGATIVE
UROBILINOGEN UA: 0.2 (ref 0.0–1.0)
pH: 5.5 (ref 5.0–8.0)

## 2016-08-27 LAB — HEPATIC FUNCTION PANEL
ALT: 18 U/L (ref 0–35)
AST: 17 U/L (ref 0–37)
Albumin: 4 g/dL (ref 3.5–5.2)
Alkaline Phosphatase: 61 U/L (ref 39–117)
Bilirubin, Direct: 0.1 mg/dL (ref 0.0–0.3)
Total Bilirubin: 0.4 mg/dL (ref 0.2–1.2)
Total Protein: 7.1 g/dL (ref 6.0–8.3)

## 2016-08-27 LAB — BASIC METABOLIC PANEL
BUN: 13 mg/dL (ref 6–23)
CALCIUM: 10.2 mg/dL (ref 8.4–10.5)
CO2: 29 mEq/L (ref 19–32)
Chloride: 100 mEq/L (ref 96–112)
Creatinine, Ser: 0.81 mg/dL (ref 0.40–1.20)
GFR: 73.07 mL/min (ref >60.00)
Glucose, Bld: 350 mg/dL — ABNORMAL HIGH (ref 70–99)
Potassium: 4.3 mEq/L (ref 3.5–5.1)
Sodium: 135 mEq/L (ref 135–145)

## 2016-08-27 LAB — MICROALBUMIN / CREATININE URINE RATIO
Creatinine,U: 39.3 mg/dL
Microalb Creat Ratio: 3.6 mg/g (ref 0.0–30.0)
Microalb, Ur: 1.4 mg/dL (ref 0.0–1.9)

## 2016-08-27 LAB — LDL CHOLESTEROL, DIRECT: Direct LDL: 69 mg/dL

## 2016-08-27 LAB — TSH: TSH: 5.86 u[IU]/mL — AB (ref 0.35–4.50)

## 2016-08-27 LAB — HEMOGLOBIN A1C: HEMOGLOBIN A1C: 12.4 % — AB (ref 4.6–6.5)

## 2016-08-27 MED ORDER — TRIAMCINOLONE ACETONIDE 0.1 % EX CREA: 1.0000 "application " | TOPICAL_CREAM | Freq: Two times a day (BID) | CUTANEOUS | 0 refills | Status: DC

## 2016-08-27 MED ORDER — FLUCONAZOLE 150 MG PO TABS: ORAL_TABLET | ORAL | 1 refills | Status: AC

## 2016-08-27 MED ORDER — INSULIN NPH (HUMAN) (ISOPHANE) 100 UNIT/ML ~~LOC~~ SUSP: SUBCUTANEOUS | 11 refills | Status: DC

## 2016-08-27 NOTE — Patient Instructions (Addendum)
You had the Pneumovax pneumonia shot today  You will be contacted regarding the referral for: Endocrinology  Please take all new medication as prescribed - the steroid cream  Please continue all other medications as before, and refills have been done if requested - the diflucan  Please have the pharmacy call with any other refills you may need.  Please continue your efforts at being more active, low cholesterol diet, and weight control.  You are otherwise up to date with prevention measures today.  Please keep your appointments with your specialists as you may have planned  Please go to the LAB in the Basement (turn left off the elevator) for the tests to be done today  You will be contacted by phone if any changes need to be made immediately.  Otherwise, you will receive a letter about your results with an explanation, but please check with MyChart first.  Please remember to sign up for MyChart if you have not done so, as this will be important to you in the future with finding out test results, communicating by private email, and scheduling acute appointments online when needed.  Please return in 1 year for your yearly visit, or sooner if needed, with Lab testing done 3-5 days before

## 2016-08-27 NOTE — Progress Notes (Signed)
Subjective:    Patient ID: Sierra Fisher, female    DOB: 09-10-1940, 76 y.o.   MRN: [id number]  HPI  Here for wellness and f/u;  Overall doing ok;  Pt denies Chest pain, worsening SOB, DOE, wheezing, orthopnea, PND, worsening LE edema, palpitations, dizziness or syncope.  Pt denies neurological change such as new headache, facial or extremity weakness.  Pt denies polydipsia, polyuria, or low sugar symptoms. Pt states overall good compliance with treatment and medications, good tolerability, and has been trying to follow appropriate diet.  Pt denies worsening depressive symptoms, suicidal ideation or panic. No fever, night sweats, wt loss, loss of appetite, or other constitutional symptoms.  Pt states good ability with ADL's, has low fall risk, home safety reviewed and adequate, no other significant changes in hearing or vision, and only occasionally active with exercise.  Declines DXA, has seen urology with neg recent exam for bladder ca recurrence.  Saw endo since last seen but will not go back there as she saw no change or improvement.  No other interval hx, except asks for diflucan refill for recurrent vaginal yeast symptoms Past Medical History:  Diagnosis Date  . Anemia    B12 deficiency & iron deficiency  . Bladder cancer ([facility])   . CHF (congestive heart failure) ([facility])    on Actos w/o complication  . Diabetes mellitus    type 2  . Fibromyalgia   . Hypertension   . Thyroid disease    hypothyroidism   Past Surgical History:  Procedure Laterality Date  . ABDOMINAL HYSTERECTOMY     for ovarian cyst  . BLADDER SURGERY    . BREAST LUMPECTOMY    . BREAST SURGERY    . CARDIAC CATHETERIZATION     negative  . CATARACT EXTRACTION  03/2004,05/2004  . CHOLECYSTECTOMY    . COLONOSCOPY      X 2; colitis X 1  . COLONOSCOPY WITH PROPOFOL N/A 04/21/2014   Procedure: COLONOSCOPY WITH PROPOFOL;  Surgeon: Carol Ada, MD;  Location: [facility];  Service: Endoscopy;  Laterality: N/A;  .  ESOPHAGOGASTRODUODENOSCOPY (EGD) WITH PROPOFOL N/A 04/21/2014   Procedure: ESOPHAGOGASTRODUODENOSCOPY (EGD) WITH PROPOFOL;  Surgeon: Carol Ada, MD;  Location: [facility];  Service: Endoscopy;  Laterality: N/A;    reports that she quit smoking about 41 years ago. She has never used smokeless tobacco. She reports that she does not drink alcohol or use drugs. family history is not on file. She was adopted. No Known Allergies Current Outpatient Prescriptions on File Prior to Visit  Medication Sig Dispense Refill  . acetaminophen (TYLENOL) 500 MG tablet Take 500 mg by mouth every 6 (six) hours as needed (Pain).    Marland Kitchen aspirin 81 MG EC tablet Take 81 mg by mouth daily.      . carvedilol (COREG) 25 MG tablet Take 1 tablet by mouth twice daily. 180 tablet 3  . chlorthalidone (HYGROTON) 25 MG tablet TAKE ONE-HALF TABLET BY MOUTH ONCE DAILY 45 tablet 2  . cyanocobalamin (,VITAMIN B-12,) 1000 MCG/ML injection 1 INJECTION MONTHLY 3 mL 1  . diphenhydrAMINE (BENADRYL) 25 mg capsule Take 25 mg by mouth every 6 (six) hours as needed for allergies.    . fluticasone (FLONASE) 50 MCG/ACT nasal spray Place 2 sprays into both nostrils daily. 16 g 0  . furosemide (LASIX) 40 MG tablet Take 1 tablet (40 mg total) by mouth daily. 30 tablet 5  . glucose blood (FREESTYLE INSULINX TEST) test strip Use as instructed to check sugar  2 times per day dx code E11.65 100 each 5  . glucose monitoring kit (FREESTYLE) monitoring kit 1 each by Does not apply route as needed.      . hydrALAZINE (APRESOLINE) 50 MG tablet Take 1 tablet (50 mg total) by mouth 3 (three) times daily. 270 tablet 3  . insulin regular (NOVOLIN R,HUMULIN R) 100 units/mL injection Inject 0.3 mLs (30 Units total) into the skin 2 (two) times daily. 10 mL 11  . KLOR-CON M20 20 MEQ tablet TAKE 1 TABLET BY MOUTH DAILY ( OVERDUE FOR ANNUAL APPOINTMENT WITH LABS MUST SEE MD FOR REFILLS) 30 tablet 0  . KRILL OIL PO Take 4 capsules by mouth daily.    Marland Kitchen levothyroxine  (SYNTHROID, LEVOTHROID) 125 MCG tablet TAKE ONE TABLET BY MOUTH EVERY DAY,EXCEPT TAKE ONE AND ONE-HALF TABLET ON TUESDAYS,THURSDAYS AND SATURDAYS 180 tablet 3  . loperamide (IMODIUM) 2 MG capsule Take 2 mg by mouth 3 (three) times daily as needed for diarrhea or loose stools.    . metFORMIN (GLUCOPHAGE-XR) 500 MG 24 hr tablet TAKE ONE TO TWO TABLETS BY MOUTH TWICE DAILY 2  IN  THE  MORNING  AND  1  IN  THE  EVENING 90 tablet 0  . Multiple Vitamin (MULTIVITAMIN WITH MINERALS) TABS tablet Take 1 tablet by mouth daily.    . Olopatadine HCl (PATADAY) 0.2 % SOLN Apply 1 drop to eye daily. 2.5 mL 0  . potassium chloride SA (KLOR-CON M20) 20 MEQ tablet Take 1 tablet (20 mEq total) by mouth daily. 90 tablet 1  . pravastatin (PRAVACHOL) 40 MG tablet TAKE 1 TABLET BY MOUTH  DAILY ( OVERDUE FOR ANNUAL APPOINTMENT WITH LABS MUST SEE MD FOR REFILLS) 30 tablet 0  . Probiotic Product (PHILLIPS COLON HEALTH PO) Take 1 tablet by mouth daily.    . ramipril (ALTACE) 5 MG capsule TAKE ONE CAPSULE BY MOUTH ONCE DAILY FOR  HYPERTENSION  AND  TO  PREVENT  DIABETIC  KIDNEY  DISEASE 30 capsule 5  . simethicone (MYLICON) 80 MG chewable tablet Chew 80 mg by mouth every 6 (six) hours as needed for flatulence.    Marland Kitchen tetrahydrozoline 0.05 % ophthalmic solution Place 1 drop into both eyes 2 (two) times daily as needed (Eye allergies).    . traMADol (ULTRAM) 50 MG tablet TAKE ONE TABLET BY MOUTH EVERY 8 HOURS AS NEEDED 60 tablet 2   No current facility-administered medications on file prior to visit.    Review of Systems Constitutional: Negative for other unusual diaphoresis, sweats, appetite or weight changes HENT: Negative for other worsening hearing loss, ear pain, facial swelling, mouth sores or neck stiffness.   Eyes: Negative for other worsening pain, redness or other visual disturbance.  Respiratory: Negative for other stridor or swelling Cardiovascular: Negative for other palpitations or other chest pain    Gastrointestinal: Negative for worsening diarrhea or loose stools, blood in stool, distention or other pain Genitourinary: Negative for hematuria, flank pain or other change in urine volume.  Musculoskeletal: Negative for myalgias or other joint swelling.  Skin: Negative for other color change, or other wound or worsening drainage.  Neurological: Negative for other syncope or numbness. Hematological: Negative for other adenopathy or swelling Psychiatric/Behavioral: Negative for hallucinations, other worsening agitation, SI, self-injury, or new decreased concentration All other system neg per pt    Objective:   Physical Exam BP (!) 144/88   Pulse 82   Ht _0  (1.6 m)   Wt 208 lb (94.3 kg)  SpO2 98%   BMI 36.85 kg/m  \VS noted,  Constitutional: Pt is oriented to person, place, and time. Appears well-developed and well-nourished, in no significant distress and comfortable Head: Normocephalic and atraumatic  Eyes: Conjunctivae and EOM are normal. Pupils are equal, round, and reactive to light Right Ear: External ear normal without discharge Left Ear: External ear normal without discharge Nose: Nose without discharge or deformity Mouth/Throat: Oropharynx is without other ulcerations and moist  Neck: Normal range of motion. Neck supple. No JVD present. No tracheal deviation present or significant neck LA or mass Cardiovascular: Normal rate, regular rhythm, normal heart sounds and intact distal pulses.   Pulmonary/Chest: WOB normal and breath sounds without rales or wheezing  Abdominal: Soft. Bowel sounds are normal. NT. No HSM  Musculoskeletal: Normal range of motion. Exhibits no edema Lymphadenopathy: Has no other cervical adenopathy.  Neurological: Pt is alert and oriented to person, place, and time. Pt has normal reflexes. No cranial nerve deficit. Motor grossly intact, Gait intact Skin: Skin is warm and dry. No rash noted or new ulcerationsbut has small area eczema to left area  near lateral epicondyle Psychiatric:  Has normal mood and affect. Behavior is normal without agitation No other exam findings       Assessment & Plan:

## 2016-08-28 ENCOUNTER — Telehealth: Payer: Self-pay

## 2016-08-28 NOTE — Telephone Encounter (Signed)
Called pt, LVM.   

## 2016-08-28 NOTE — Telephone Encounter (Signed)
-----   Message from Biagio Borg, MD sent at 08/27/2016  8:27 PM EDT ----- Left message on MyChart, pt to cont same tx except  The test results show that your current treatment is OK, except the A1c is very poorly controlled.  Please increase your NPH insulin to 40 units twice per day.  Please follow up with Endocrinology as you have been referred.Redmond Baseman to please inform pt, I will do rx

## 2016-08-29 NOTE — Telephone Encounter (Signed)
Pt returned your call. I gave her MD response. She expressed understanding and did not have any questions at this time.

## 2016-08-30 NOTE — Assessment & Plan Note (Signed)

## 2016-08-30 NOTE — Assessment & Plan Note (Signed)
For triam cream prn,

## 2016-08-30 NOTE — Assessment & Plan Note (Signed)
[place] for referral to new endo,  to f/u any worsening symptoms or concerns

## 2016-08-30 NOTE — Assessment & Plan Note (Signed)
For diflucan asd 

## 2016-09-10 ENCOUNTER — Other Ambulatory Visit: Payer: Self-pay | Admitting: Neurosurgery

## 2016-09-10 DIAGNOSIS — M5416 Radiculopathy, lumbar region: Secondary | ICD-10-CM

## 2016-09-25 ENCOUNTER — Ambulatory Visit
Admission: RE | Admit: 2016-09-25 | Discharge: 2016-09-25 | Disposition: A | Discharge status: Home / Self Care | Payer: Medicare HMO | Source: Ambulatory Visit | Attending: Neurosurgery | Admitting: Neurosurgery

## 2016-09-25 DIAGNOSIS — M545 Low back pain: Secondary | ICD-10-CM | POA: Diagnosis not present

## 2016-09-25 DIAGNOSIS — M5416 Radiculopathy, lumbar region: Secondary | ICD-10-CM

## 2016-09-25 MED ORDER — METHYLPREDNISOLONE ACETATE 40 MG/ML INJ SUSP (RADIOLOG
120.0000 mg | Freq: Once | INTRAMUSCULAR | Status: AC
  Administered 2016-09-25: 120 mg via EPIDURAL

## 2016-09-25 MED ORDER — IOPAMIDOL (ISOVUE-M 200) INJECTION 41%
1.0000 mL | Freq: Once | INTRAMUSCULAR | Status: AC
  Administered 2016-09-25: 1 mL via EPIDURAL

## 2016-10-07 ENCOUNTER — Other Ambulatory Visit: Payer: Self-pay | Admitting: Internal Medicine

## 2016-10-15 ENCOUNTER — Telehealth: Payer: Self-pay | Admitting: Internal Medicine

## 2016-10-15 DIAGNOSIS — E039 Hypothyroidism, unspecified: Secondary | ICD-10-CM

## 2016-10-15 MED ORDER — CARVEDILOL 25 MG PO TABS: 25.0000 mg | ORAL_TABLET | Freq: Two times a day (BID) | ORAL | 3 refills | Status: AC

## 2016-10-15 MED ORDER — POTASSIUM CHLORIDE CRYS ER 20 MEQ PO TBCR: EXTENDED_RELEASE_TABLET | ORAL | 3 refills | Status: AC

## 2016-10-15 MED ORDER — CHLORTHALIDONE 25 MG PO TABS: 12.5000 mg | ORAL_TABLET | Freq: Every day | ORAL | 2 refills | Status: AC

## 2016-10-15 MED ORDER — HYDRALAZINE HCL 50 MG PO TABS: 50.0000 mg | ORAL_TABLET | Freq: Three times a day (TID) | ORAL | 3 refills | Status: AC

## 2016-10-15 MED ORDER — METFORMIN HCL ER 500 MG PO TB24: ORAL_TABLET | ORAL | 3 refills | Status: DC

## 2016-10-15 MED ORDER — PRAVASTATIN SODIUM 40 MG PO TABS: ORAL_TABLET | ORAL | 3 refills | Status: AC

## 2016-10-15 MED ORDER — RAMIPRIL 5 MG PO CAPS: ORAL_CAPSULE | ORAL | 3 refills | Status: AC

## 2016-10-15 MED ORDER — INSULIN NPH (HUMAN) (ISOPHANE) 100 UNIT/ML ~~LOC~~ SUSP: SUBCUTANEOUS | 11 refills | Status: AC

## 2016-10-15 MED ORDER — LEVOTHYROXINE SODIUM 125 MCG PO TABS: ORAL_TABLET | ORAL | 3 refills | Status: AC

## 2016-10-15 MED ORDER — TRIAMCINOLONE ACETONIDE 0.1 % EX CREA: 1.0000 "application " | TOPICAL_CREAM | Freq: Two times a day (BID) | CUTANEOUS | 0 refills | Status: AC

## 2016-10-15 MED ORDER — POTASSIUM CHLORIDE CRYS ER 20 MEQ PO TBCR: 20.0000 meq | EXTENDED_RELEASE_TABLET | Freq: Every day | ORAL | 1 refills | Status: AC

## 2016-10-15 MED ORDER — FUROSEMIDE 40 MG PO TABS: 40.0000 mg | ORAL_TABLET | Freq: Every day | ORAL | 3 refills | Status: AC

## 2016-10-15 MED ORDER — INSULIN REGULAR HUMAN 100 UNIT/ML IJ SOLN: 30.0000 [IU] | Freq: Two times a day (BID) | INTRAMUSCULAR | 11 refills | Status: AC

## 2016-10-15 NOTE — Telephone Encounter (Signed)
Spoke to patient, all meds have been sent if with refills.

## 2016-10-15 NOTE — Telephone Encounter (Signed)
Pt called stating the pharmacy said she needs to come in for labs for future refills, she had labs in July and had a med fu in July, please advise and call back does she need to come in for labs

## 2016-11-10 DIAGNOSIS — Z794 Long term (current) use of insulin: Secondary | ICD-10-CM | POA: Diagnosis not present

## 2016-11-10 DIAGNOSIS — E1165 Type 2 diabetes mellitus with hyperglycemia: Secondary | ICD-10-CM | POA: Diagnosis not present

## 2016-11-10 DIAGNOSIS — E039 Hypothyroidism, unspecified: Secondary | ICD-10-CM | POA: Diagnosis not present

## 2016-11-10 DIAGNOSIS — Z79899 Other long term (current) drug therapy: Secondary | ICD-10-CM | POA: Diagnosis not present

## 2016-11-24 DIAGNOSIS — Z79899 Other long term (current) drug therapy: Secondary | ICD-10-CM | POA: Diagnosis not present

## 2016-11-24 DIAGNOSIS — E782 Mixed hyperlipidemia: Secondary | ICD-10-CM | POA: Diagnosis not present

## 2016-11-24 DIAGNOSIS — I509 Heart failure, unspecified: Secondary | ICD-10-CM | POA: Diagnosis not present

## 2016-11-24 DIAGNOSIS — E1165 Type 2 diabetes mellitus with hyperglycemia: Secondary | ICD-10-CM | POA: Diagnosis not present

## 2016-11-24 DIAGNOSIS — Z794 Long term (current) use of insulin: Secondary | ICD-10-CM | POA: Diagnosis not present

## 2016-11-24 DIAGNOSIS — E538 Deficiency of other specified B group vitamins: Secondary | ICD-10-CM | POA: Diagnosis not present

## 2016-11-24 DIAGNOSIS — R69 Illness, unspecified: Secondary | ICD-10-CM | POA: Diagnosis not present

## 2016-11-24 DIAGNOSIS — E039 Hypothyroidism, unspecified: Secondary | ICD-10-CM | POA: Diagnosis not present

## 2016-11-24 DIAGNOSIS — I119 Hypertensive heart disease without heart failure: Secondary | ICD-10-CM | POA: Diagnosis not present

## 2016-12-11 DIAGNOSIS — Z6835 Body mass index (BMI) 35.0-35.9, adult: Secondary | ICD-10-CM | POA: Diagnosis not present

## 2016-12-11 DIAGNOSIS — M4310 Spondylolisthesis, site unspecified: Secondary | ICD-10-CM | POA: Diagnosis not present

## 2016-12-11 DIAGNOSIS — I10 Essential (primary) hypertension: Secondary | ICD-10-CM | POA: Diagnosis not present

## 2016-12-15 ENCOUNTER — Other Ambulatory Visit: Payer: Self-pay | Admitting: Internal Medicine

## 2016-12-16 DIAGNOSIS — Z79899 Other long term (current) drug therapy: Secondary | ICD-10-CM | POA: Diagnosis not present

## 2016-12-16 DIAGNOSIS — I110 Hypertensive heart disease with heart failure: Secondary | ICD-10-CM | POA: Diagnosis not present

## 2016-12-16 DIAGNOSIS — E1165 Type 2 diabetes mellitus with hyperglycemia: Secondary | ICD-10-CM | POA: Diagnosis not present

## 2016-12-16 DIAGNOSIS — E782 Mixed hyperlipidemia: Secondary | ICD-10-CM | POA: Diagnosis not present

## 2016-12-16 DIAGNOSIS — E039 Hypothyroidism, unspecified: Secondary | ICD-10-CM | POA: Diagnosis not present

## 2016-12-16 DIAGNOSIS — Z794 Long term (current) use of insulin: Secondary | ICD-10-CM | POA: Diagnosis not present

## 2016-12-16 DIAGNOSIS — E538 Deficiency of other specified B group vitamins: Secondary | ICD-10-CM | POA: Diagnosis not present

## 2016-12-16 DIAGNOSIS — I509 Heart failure, unspecified: Secondary | ICD-10-CM | POA: Diagnosis not present

## 2016-12-17 DIAGNOSIS — I10 Essential (primary) hypertension: Secondary | ICD-10-CM | POA: Diagnosis not present

## 2016-12-17 DIAGNOSIS — M25561 Pain in right knee: Secondary | ICD-10-CM | POA: Diagnosis not present

## 2016-12-17 DIAGNOSIS — M4310 Spondylolisthesis, site unspecified: Secondary | ICD-10-CM | POA: Diagnosis not present

## 2016-12-17 DIAGNOSIS — Z6835 Body mass index (BMI) 35.0-35.9, adult: Secondary | ICD-10-CM | POA: Diagnosis not present

## 2016-12-17 DIAGNOSIS — M545 Low back pain: Secondary | ICD-10-CM | POA: Diagnosis not present

## 2016-12-19 DIAGNOSIS — M1711 Unilateral primary osteoarthritis, right knee: Secondary | ICD-10-CM | POA: Diagnosis not present

## 2017-03-30 DIAGNOSIS — N3946 Mixed incontinence: Secondary | ICD-10-CM | POA: Diagnosis not present

## 2017-03-30 DIAGNOSIS — Z8551 Personal history of malignant neoplasm of bladder: Secondary | ICD-10-CM | POA: Diagnosis not present

## 2017-03-30 DIAGNOSIS — C679 Malignant neoplasm of bladder, unspecified: Secondary | ICD-10-CM | POA: Diagnosis not present

## 2017-05-13 DIAGNOSIS — I161 Hypertensive emergency: Secondary | ICD-10-CM | POA: Diagnosis not present

## 2017-05-13 DIAGNOSIS — M545 Low back pain: Secondary | ICD-10-CM | POA: Diagnosis not present

## 2017-05-13 DIAGNOSIS — E538 Deficiency of other specified B group vitamins: Secondary | ICD-10-CM | POA: Diagnosis not present

## 2017-05-13 DIAGNOSIS — I10 Essential (primary) hypertension: Secondary | ICD-10-CM | POA: Diagnosis not present

## 2017-05-13 DIAGNOSIS — E039 Hypothyroidism, unspecified: Secondary | ICD-10-CM | POA: Diagnosis not present

## 2017-05-13 DIAGNOSIS — E119 Type 2 diabetes mellitus without complications: Secondary | ICD-10-CM | POA: Diagnosis not present

## 2017-05-15 DIAGNOSIS — I161 Hypertensive emergency: Secondary | ICD-10-CM | POA: Diagnosis not present

## 2017-05-15 DIAGNOSIS — I517 Cardiomegaly: Secondary | ICD-10-CM | POA: Diagnosis not present

## 2017-05-15 DIAGNOSIS — I5189 Other ill-defined heart diseases: Secondary | ICD-10-CM | POA: Diagnosis not present

## 2017-05-20 DIAGNOSIS — I10 Essential (primary) hypertension: Secondary | ICD-10-CM | POA: Diagnosis not present

## 2017-05-20 DIAGNOSIS — R5383 Other fatigue: Secondary | ICD-10-CM | POA: Diagnosis not present

## 2017-05-20 DIAGNOSIS — G4700 Insomnia, unspecified: Secondary | ICD-10-CM | POA: Diagnosis not present

## 2017-05-20 DIAGNOSIS — E119 Type 2 diabetes mellitus without complications: Secondary | ICD-10-CM | POA: Diagnosis not present

## 2017-06-04 DIAGNOSIS — E1165 Type 2 diabetes mellitus with hyperglycemia: Secondary | ICD-10-CM | POA: Diagnosis not present

## 2017-06-04 DIAGNOSIS — E1140 Type 2 diabetes mellitus with diabetic neuropathy, unspecified: Secondary | ICD-10-CM | POA: Diagnosis not present

## 2017-06-04 DIAGNOSIS — E1151 Type 2 diabetes mellitus with diabetic peripheral angiopathy without gangrene: Secondary | ICD-10-CM | POA: Diagnosis not present

## 2017-06-04 DIAGNOSIS — E538 Deficiency of other specified B group vitamins: Secondary | ICD-10-CM | POA: Diagnosis not present

## 2017-06-11 DIAGNOSIS — E1165 Type 2 diabetes mellitus with hyperglycemia: Secondary | ICD-10-CM | POA: Diagnosis not present

## 2017-06-11 DIAGNOSIS — I10 Essential (primary) hypertension: Secondary | ICD-10-CM | POA: Diagnosis not present

## 2017-06-11 DIAGNOSIS — E7800 Pure hypercholesterolemia, unspecified: Secondary | ICD-10-CM | POA: Diagnosis not present

## 2017-06-11 DIAGNOSIS — R0609 Other forms of dyspnea: Secondary | ICD-10-CM | POA: Diagnosis not present

## 2017-06-18 DIAGNOSIS — I10 Essential (primary) hypertension: Secondary | ICD-10-CM | POA: Diagnosis not present

## 2017-06-18 DIAGNOSIS — E1165 Type 2 diabetes mellitus with hyperglycemia: Secondary | ICD-10-CM | POA: Diagnosis not present

## 2017-06-18 DIAGNOSIS — E782 Mixed hyperlipidemia: Secondary | ICD-10-CM | POA: Diagnosis not present

## 2017-06-18 DIAGNOSIS — E039 Hypothyroidism, unspecified: Secondary | ICD-10-CM | POA: Diagnosis not present

## 2017-06-24 DIAGNOSIS — E119 Type 2 diabetes mellitus without complications: Secondary | ICD-10-CM | POA: Diagnosis not present

## 2017-06-24 DIAGNOSIS — M545 Low back pain: Secondary | ICD-10-CM | POA: Diagnosis not present

## 2017-06-24 DIAGNOSIS — E039 Hypothyroidism, unspecified: Secondary | ICD-10-CM | POA: Diagnosis not present

## 2017-06-24 DIAGNOSIS — I10 Essential (primary) hypertension: Secondary | ICD-10-CM | POA: Diagnosis not present

## 2017-07-06 ENCOUNTER — Other Ambulatory Visit: Payer: Self-pay | Admitting: Internal Medicine

## 2017-07-16 DIAGNOSIS — I10 Essential (primary) hypertension: Secondary | ICD-10-CM | POA: Diagnosis not present

## 2017-07-16 DIAGNOSIS — R0600 Dyspnea, unspecified: Secondary | ICD-10-CM | POA: Diagnosis not present

## 2017-07-16 DIAGNOSIS — R0609 Other forms of dyspnea: Secondary | ICD-10-CM | POA: Diagnosis not present

## 2017-07-20 DIAGNOSIS — E041 Nontoxic single thyroid nodule: Secondary | ICD-10-CM | POA: Diagnosis not present

## 2017-07-21 DIAGNOSIS — R69 Illness, unspecified: Secondary | ICD-10-CM | POA: Diagnosis not present

## 2017-08-10 DIAGNOSIS — E039 Hypothyroidism, unspecified: Secondary | ICD-10-CM | POA: Diagnosis not present

## 2017-08-10 DIAGNOSIS — E1165 Type 2 diabetes mellitus with hyperglycemia: Secondary | ICD-10-CM | POA: Diagnosis not present

## 2017-08-10 DIAGNOSIS — E782 Mixed hyperlipidemia: Secondary | ICD-10-CM | POA: Diagnosis not present

## 2017-08-11 DIAGNOSIS — E039 Hypothyroidism, unspecified: Secondary | ICD-10-CM | POA: Diagnosis not present

## 2017-08-17 DIAGNOSIS — E1140 Type 2 diabetes mellitus with diabetic neuropathy, unspecified: Secondary | ICD-10-CM | POA: Diagnosis not present

## 2017-08-17 DIAGNOSIS — E039 Hypothyroidism, unspecified: Secondary | ICD-10-CM | POA: Diagnosis not present

## 2017-08-17 DIAGNOSIS — E041 Nontoxic single thyroid nodule: Secondary | ICD-10-CM | POA: Diagnosis not present

## 2017-08-17 DIAGNOSIS — E1165 Type 2 diabetes mellitus with hyperglycemia: Secondary | ICD-10-CM | POA: Diagnosis not present

## 2017-09-23 DIAGNOSIS — E039 Hypothyroidism, unspecified: Secondary | ICD-10-CM | POA: Diagnosis not present

## 2017-09-23 DIAGNOSIS — E119 Type 2 diabetes mellitus without complications: Secondary | ICD-10-CM | POA: Diagnosis not present

## 2017-09-23 DIAGNOSIS — Z8603 Personal history of neoplasm of uncertain behavior: Secondary | ICD-10-CM | POA: Diagnosis not present

## 2017-09-23 DIAGNOSIS — R319 Hematuria, unspecified: Secondary | ICD-10-CM | POA: Diagnosis not present

## 2017-09-23 DIAGNOSIS — R1030 Lower abdominal pain, unspecified: Secondary | ICD-10-CM | POA: Diagnosis not present

## 2017-09-23 DIAGNOSIS — M545 Low back pain: Secondary | ICD-10-CM | POA: Diagnosis not present

## 2017-09-23 DIAGNOSIS — I10 Essential (primary) hypertension: Secondary | ICD-10-CM | POA: Diagnosis not present

## 2017-10-15 DIAGNOSIS — I10 Essential (primary) hypertension: Secondary | ICD-10-CM | POA: Diagnosis not present

## 2017-10-19 DIAGNOSIS — S022XXA Fracture of nasal bones, initial encounter for closed fracture: Secondary | ICD-10-CM | POA: Diagnosis not present

## 2017-10-19 DIAGNOSIS — W182XXA Fall in (into) shower or empty bathtub, initial encounter: Secondary | ICD-10-CM | POA: Diagnosis not present

## 2017-10-19 DIAGNOSIS — S0990XA Unspecified injury of head, initial encounter: Secondary | ICD-10-CM | POA: Diagnosis not present

## 2017-10-19 DIAGNOSIS — E119 Type 2 diabetes mellitus without complications: Secondary | ICD-10-CM | POA: Diagnosis not present

## 2017-10-19 DIAGNOSIS — I10 Essential (primary) hypertension: Secondary | ICD-10-CM | POA: Diagnosis not present

## 2017-10-19 DIAGNOSIS — I160 Hypertensive urgency: Secondary | ICD-10-CM | POA: Diagnosis not present

## 2017-10-19 DIAGNOSIS — S0510XA Contusion of eyeball and orbital tissues, unspecified eye, initial encounter: Secondary | ICD-10-CM | POA: Diagnosis not present

## 2017-10-19 DIAGNOSIS — Z794 Long term (current) use of insulin: Secondary | ICD-10-CM | POA: Diagnosis not present

## 2017-10-22 DIAGNOSIS — S022XXA Fracture of nasal bones, initial encounter for closed fracture: Secondary | ICD-10-CM | POA: Diagnosis not present

## 2017-10-27 ENCOUNTER — Other Ambulatory Visit: Payer: Self-pay | Admitting: Internal Medicine

## 2017-10-30 DIAGNOSIS — E039 Hypothyroidism, unspecified: Secondary | ICD-10-CM | POA: Diagnosis not present

## 2017-10-30 DIAGNOSIS — E1165 Type 2 diabetes mellitus with hyperglycemia: Secondary | ICD-10-CM | POA: Diagnosis not present

## 2017-10-30 DIAGNOSIS — E1140 Type 2 diabetes mellitus with diabetic neuropathy, unspecified: Secondary | ICD-10-CM | POA: Diagnosis not present

## 2017-11-09 DIAGNOSIS — E1140 Type 2 diabetes mellitus with diabetic neuropathy, unspecified: Secondary | ICD-10-CM | POA: Diagnosis not present

## 2017-11-09 DIAGNOSIS — E1165 Type 2 diabetes mellitus with hyperglycemia: Secondary | ICD-10-CM | POA: Diagnosis not present

## 2017-11-09 DIAGNOSIS — E041 Nontoxic single thyroid nodule: Secondary | ICD-10-CM | POA: Diagnosis not present

## 2017-11-09 DIAGNOSIS — E039 Hypothyroidism, unspecified: Secondary | ICD-10-CM | POA: Diagnosis not present

## 2018-02-01 DIAGNOSIS — E1165 Type 2 diabetes mellitus with hyperglycemia: Secondary | ICD-10-CM | POA: Diagnosis not present

## 2018-02-01 DIAGNOSIS — E559 Vitamin D deficiency, unspecified: Secondary | ICD-10-CM | POA: Diagnosis not present

## 2018-02-01 DIAGNOSIS — E782 Mixed hyperlipidemia: Secondary | ICD-10-CM | POA: Diagnosis not present

## 2018-02-01 DIAGNOSIS — E039 Hypothyroidism, unspecified: Secondary | ICD-10-CM | POA: Diagnosis not present

## 2018-02-01 DIAGNOSIS — E538 Deficiency of other specified B group vitamins: Secondary | ICD-10-CM | POA: Diagnosis not present

## 2018-02-16 IMAGING — XA Imaging study
2 series · 2 of 2 positions shown · non-contrast
Comparison: none

CLINICAL DATA: Lumbosacral spondylosis without myelopathy.
Left-sided low back and left lower extremity pain. The patient
reports substantial relief from the 2 prior left L5 nerve root
blocks, however the relief has not been as sustained as she would
like. MRI demonstrates left lateral recess stenosis at L4-5. An L4-5
interlaminar injection will be performed today with the hopes of
obtaining more sustained relief as well as addressing some new mild
right-sided low back pain.

[Series 1: ortho standard · 1 of 1 slices shown (1 of 2)]
[im 1/1]
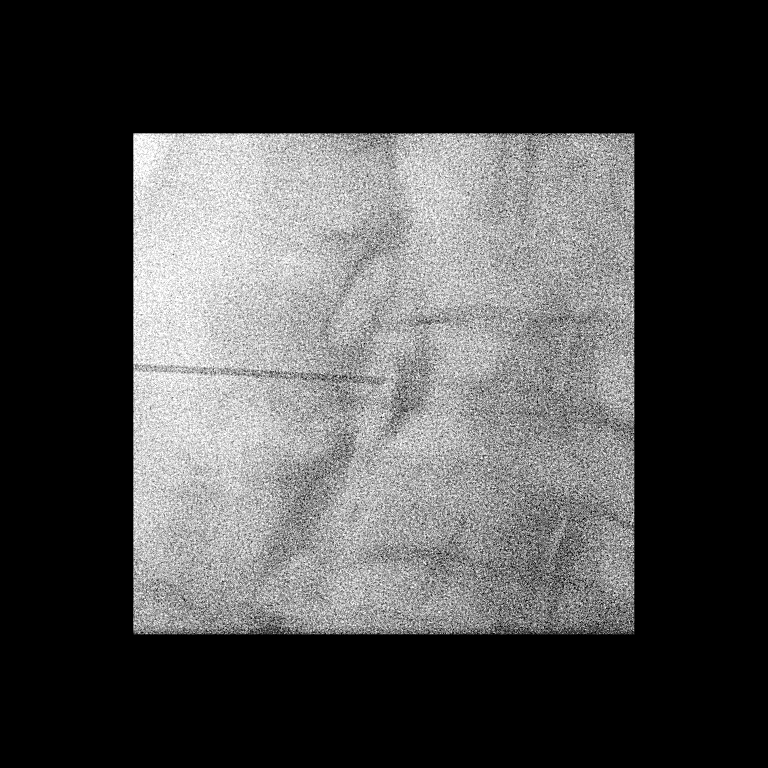

[Series 2: ortho standard · 1 of 1 slices shown (2 of 2)]
[im 1/1]
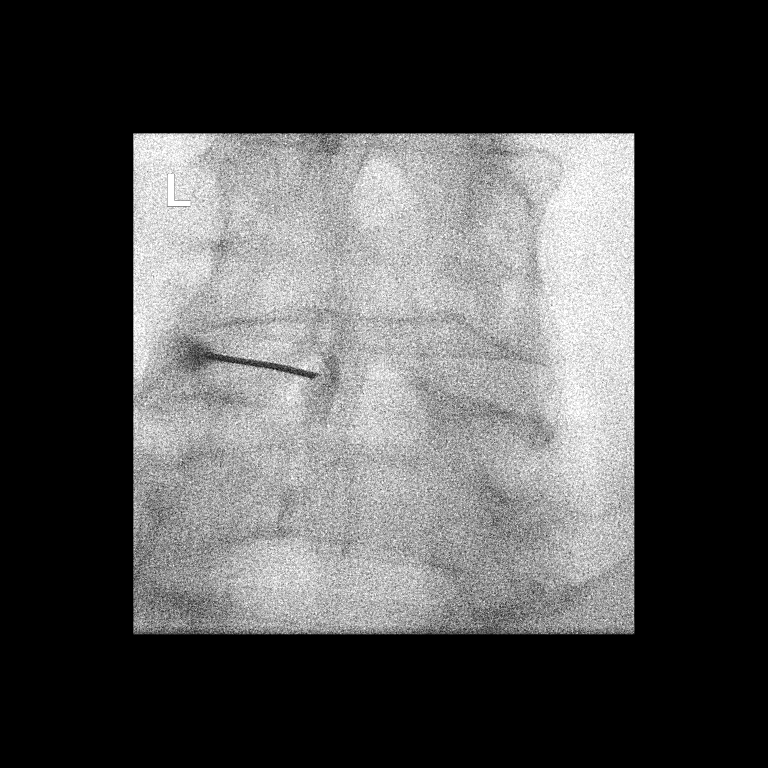

[2 of 2 positions shown; findings below may reference images not displayed]

FLUOROSCOPY TIME:  Radiation Exposure Index (as provided by the
fluoroscopic device): 15.92 microGray*m^2

Fluoroscopy Time (in minutes and seconds):  9 seconds

PROCEDURE:
The procedure, risks, benefits, and alternatives were explained to
the patient. Questions regarding the procedure were encouraged and
answered. The patient understands and consents to the procedure.

LUMBAR EPIDURAL INJECTION:

An interlaminar approach was performed on the left at L4-5. The
overlying skin was cleansed and anesthetized. A 3.5 inch 20 gauge
epidural needle was advanced using loss-of-resistance technique.

DIAGNOSTIC EPIDURAL INJECTION:

Injection of Isovue-M 200 shows a good epidural pattern with spread
above and below the level of needle placement, primarily on the
left. No vascular opacification is seen.

THERAPEUTIC EPIDURAL INJECTION:

120 mg of Depo-Medrol mixed with 3 mL of 1% lidocaine were
instilled. The procedure was well-tolerated, and the patient was
discharged thirty minutes following the injection in good condition.

COMPLICATIONS:
None
IMPRESSION: Technically successful lumbar interlaminar epidural injection on the
left at L4-5.

## 2018-11-03 IMAGING — XA Imaging study
2 series · 2 of 2 positions shown · non-contrast
Comparison: none

CLINICAL DATA: Lumbosacral spondylosis without myelopathy.
Excellent response to the interlaminar epidural injection in
[DATE], with relief lasting 7 months. There was better sustained
relief compared to the prior transforaminal injections. Low back and
lower extremity pain has returned and is now worse on the right.

[Series 1: ortho adipose · 1 of 1 slices shown (1 of 2)]
[im 1/1]
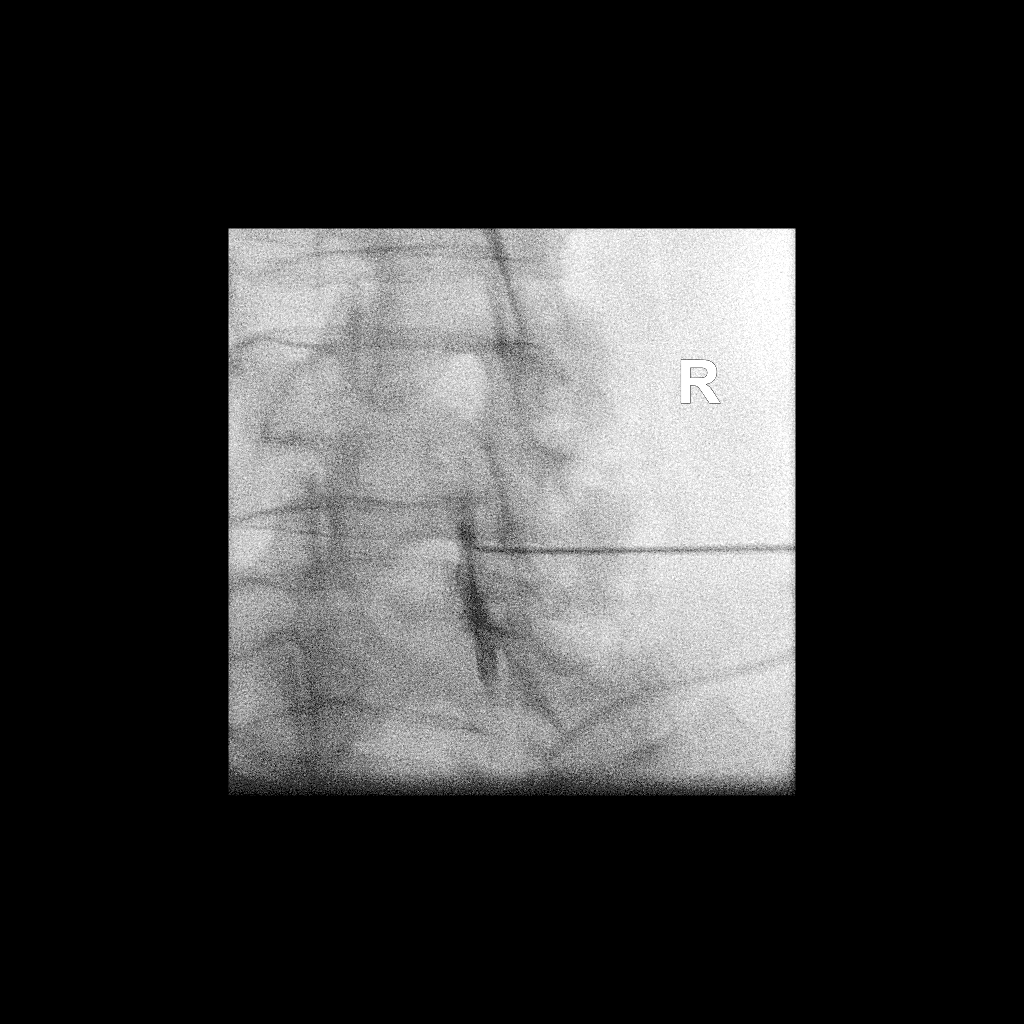

[Series 2: ortho adipose · 1 of 1 slices shown (2 of 2)]
[im 1/1]
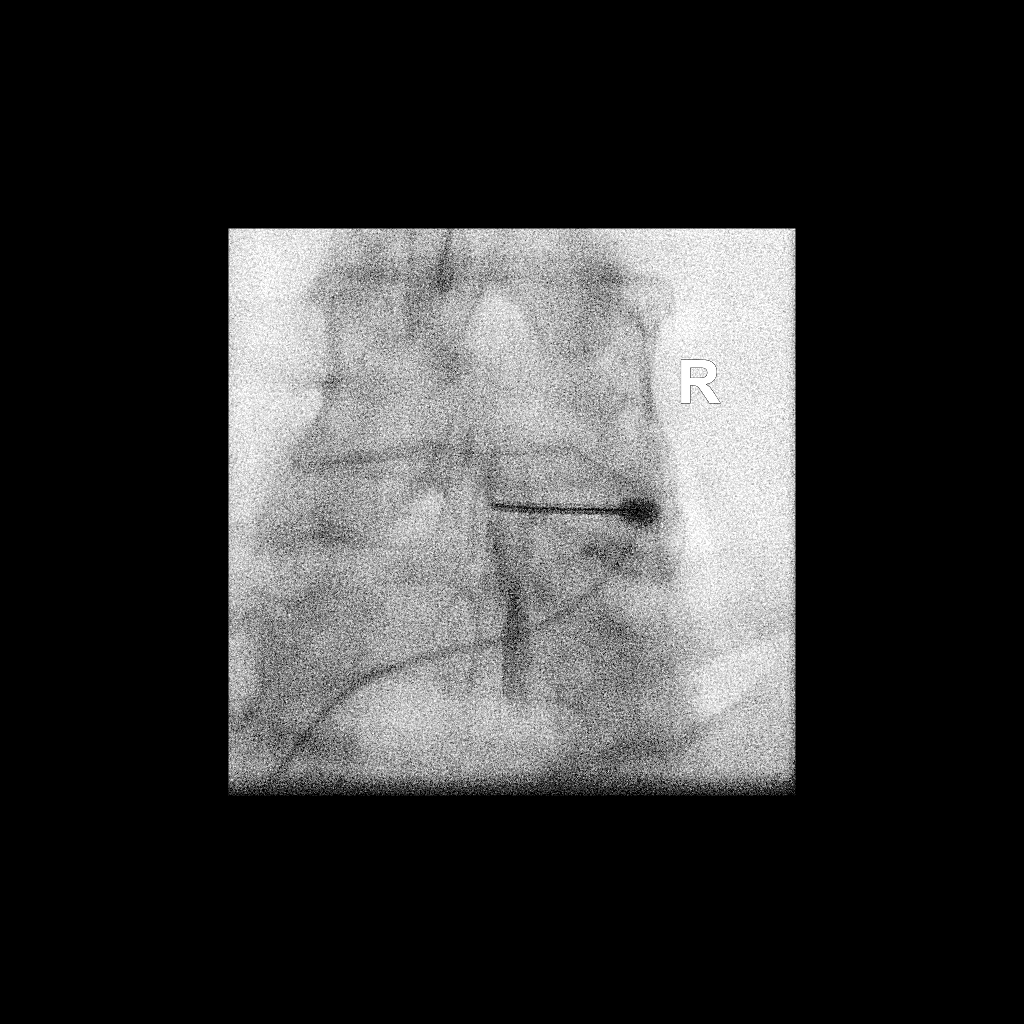

[2 of 2 positions shown; findings below may reference images not displayed]

FLUOROSCOPY TIME:  Radiation Exposure Index (as provided by the
fluoroscopic device): 21.06 microGray*m^2

Fluoroscopy Time (in minutes and seconds):  8 seconds

PROCEDURE:
The procedure, risks, benefits, and alternatives were explained to
the patient. Questions regarding the procedure were encouraged and
answered. The patient understands and consents to the procedure.

LUMBAR EPIDURAL INJECTION:

An interlaminar approach was performed on the right at L4-5. The
overlying skin was cleansed and anesthetized. A 3.5 inch 20 gauge
epidural needle was advanced using loss-of-resistance technique.

DIAGNOSTIC EPIDURAL INJECTION:

Injection of Isovue-M 200 shows a good epidural pattern with spread
above and below the level of needle placement, primarily on the
right. No vascular opacification is seen.

THERAPEUTIC EPIDURAL INJECTION:

120 mg of Depo-Medrol mixed with 3 mL of 1% lidocaine were
instilled. The procedure was well-tolerated, and the patient was
discharged thirty minutes following the injection in good condition.

COMPLICATIONS:
None
IMPRESSION: Technically successful lumbar interlaminar epidural injection on the
right at L4-5.
